# Patient Record
Sex: Female | Born: 1950
Health system: Southern US, Community
[De-identification: ages and names within clinical notes are randomized; demographics above are authoritative.]

## PROBLEM LIST (undated history)

## (undated) DIAGNOSIS — E7800 Pure hypercholesterolemia, unspecified: Secondary | ICD-10-CM

## (undated) HISTORY — PX: OTHER SURGICAL HISTORY: SHX169

## (undated) HISTORY — PX: BREAST BIOPSY: SHX20

## (undated) HISTORY — PX: APPENDECTOMY: SHX54

---

## 2006-03-16 ENCOUNTER — Ambulatory Visit: Payer: Self-pay | Admitting: Family Medicine

## 2006-03-19 ENCOUNTER — Ambulatory Visit: Payer: Self-pay | Admitting: Family Medicine

## 2006-03-26 ENCOUNTER — Ambulatory Visit: Payer: Self-pay | Admitting: Family Medicine

## 2006-04-01 ENCOUNTER — Ambulatory Visit: Payer: Self-pay | Admitting: Family Medicine

## 2006-04-27 DIAGNOSIS — J189 Pneumonia, unspecified organism: Secondary | ICD-10-CM | POA: Insufficient documentation

## 2006-04-27 DIAGNOSIS — J449 Chronic obstructive pulmonary disease, unspecified: Secondary | ICD-10-CM

## 2006-04-27 DIAGNOSIS — J4489 Other specified chronic obstructive pulmonary disease: Secondary | ICD-10-CM | POA: Insufficient documentation

## 2006-04-30 ENCOUNTER — Ambulatory Visit: Payer: Self-pay | Admitting: Family Medicine

## 2006-05-05 ENCOUNTER — Encounter: Payer: Self-pay | Admitting: Family Medicine

## 2006-05-05 DIAGNOSIS — F17200 Nicotine dependence, unspecified, uncomplicated: Secondary | ICD-10-CM

## 2006-06-16 ENCOUNTER — Encounter: Payer: Self-pay | Admitting: Family Medicine

## 2006-06-16 ENCOUNTER — Other Ambulatory Visit: Admission: RE | Admit: 2006-06-16 | Discharge: 2006-06-16 | Payer: Self-pay | Admitting: Family Medicine

## 2006-06-16 ENCOUNTER — Ambulatory Visit: Payer: Self-pay | Admitting: Family Medicine

## 2006-06-16 LAB — CONVERTED CEMR LAB: Pap Smear: NEGATIVE

## 2006-06-18 ENCOUNTER — Encounter: Payer: Self-pay | Admitting: Family Medicine

## 2006-07-06 ENCOUNTER — Encounter: Payer: Self-pay | Admitting: Family Medicine

## 2006-07-07 LAB — CONVERTED CEMR LAB
ALT: 13 units/L (ref 0–35)
AST: 19 units/L (ref 0–37)
Calcium: 9.7 mg/dL (ref 8.4–10.5)
Chloride: 103 meq/L (ref 96–112)
Creatinine, Ser: 0.77 mg/dL (ref 0.40–1.20)
Sodium: 140 meq/L (ref 135–145)
Total CHOL/HDL Ratio: 8.9
Total Protein: 7.8 g/dL (ref 6.0–8.3)
VLDL: 80 mg/dL — ABNORMAL HIGH (ref 0–40)

## 2006-08-10 ENCOUNTER — Encounter: Payer: Self-pay | Admitting: Family Medicine

## 2006-08-18 ENCOUNTER — Encounter: Payer: Self-pay | Admitting: Family Medicine

## 2006-08-18 DIAGNOSIS — M949 Disorder of cartilage, unspecified: Secondary | ICD-10-CM

## 2006-08-18 DIAGNOSIS — M899 Disorder of bone, unspecified: Secondary | ICD-10-CM | POA: Insufficient documentation

## 2006-08-19 ENCOUNTER — Telehealth: Payer: Self-pay | Admitting: Family Medicine

## 2007-04-12 ENCOUNTER — Encounter: Payer: Self-pay | Admitting: Family Medicine

## 2007-04-26 ENCOUNTER — Telehealth: Payer: Self-pay | Admitting: Family Medicine

## 2007-04-26 ENCOUNTER — Encounter: Payer: Self-pay | Admitting: Family Medicine

## 2007-04-26 DIAGNOSIS — Z8679 Personal history of other diseases of the circulatory system: Secondary | ICD-10-CM | POA: Insufficient documentation

## 2007-04-26 DIAGNOSIS — J90 Pleural effusion, not elsewhere classified: Secondary | ICD-10-CM | POA: Insufficient documentation

## 2007-05-17 ENCOUNTER — Encounter: Payer: Self-pay | Admitting: Family Medicine

## 2007-11-03 ENCOUNTER — Ambulatory Visit: Payer: Self-pay | Admitting: Family Medicine

## 2007-11-03 ENCOUNTER — Encounter: Payer: Self-pay | Admitting: Family Medicine

## 2007-11-03 ENCOUNTER — Other Ambulatory Visit: Admission: RE | Admit: 2007-11-03 | Discharge: 2007-11-03 | Payer: Self-pay | Admitting: Family Medicine

## 2007-11-03 DIAGNOSIS — D649 Anemia, unspecified: Secondary | ICD-10-CM

## 2007-11-04 ENCOUNTER — Encounter: Payer: Self-pay | Admitting: Family Medicine

## 2007-11-07 ENCOUNTER — Encounter: Payer: Self-pay | Admitting: Family Medicine

## 2007-11-07 ENCOUNTER — Telehealth (INDEPENDENT_AMBULATORY_CARE_PROVIDER_SITE_OTHER): Payer: Self-pay | Admitting: *Deleted

## 2007-11-07 DIAGNOSIS — K5732 Diverticulitis of large intestine without perforation or abscess without bleeding: Secondary | ICD-10-CM | POA: Insufficient documentation

## 2007-11-07 LAB — CONVERTED CEMR LAB
HCV Ab: NEGATIVE
Hep A IgM: NEGATIVE

## 2007-11-08 DIAGNOSIS — E785 Hyperlipidemia, unspecified: Secondary | ICD-10-CM

## 2007-11-08 LAB — CONVERTED CEMR LAB
ALT: 95 units/L — ABNORMAL HIGH (ref 0–35)
AST: 116 units/L — ABNORMAL HIGH (ref 0–37)
Albumin: 3.9 g/dL (ref 3.5–5.2)
Alkaline Phosphatase: 71 units/L (ref 39–117)
BUN: 9 mg/dL (ref 6–23)
Basophils Absolute: 0.1 10*3/uL (ref 0.0–0.1)
Basophils Relative: 1 % (ref 0–1)
CO2: 23 meq/L (ref 19–32)
Calcium: 9.4 mg/dL (ref 8.4–10.5)
Chloride: 106 meq/L (ref 96–112)
Cholesterol: 253 mg/dL — ABNORMAL HIGH (ref 0–200)
Creatinine, Ser: 0.63 mg/dL (ref 0.40–1.20)
Eosinophils Absolute: 0.3 10*3/uL (ref 0.0–0.7)
Eosinophils Relative: 4 % (ref 0–5)
Glucose, Bld: 94 mg/dL (ref 70–99)
HCT: 44.4 % (ref 36.0–46.0)
HDL: 24 mg/dL — ABNORMAL LOW (ref >39)
Hemoglobin: 14.3 g/dL (ref 12.0–15.0)
Lymphocytes Relative: 43 % (ref 12–46)
Lymphs Abs: 2.8 10*3/uL (ref 0.7–4.0)
MCHC: 32.2 g/dL (ref 30.0–36.0)
MCV: 95.9 fL (ref 78.0–100.0)
Monocytes Absolute: 0.6 10*3/uL (ref 0.1–1.0)
Monocytes Relative: 9 % (ref 3–12)
Neutro Abs: 2.8 10*3/uL (ref 1.7–7.7)
Neutrophils Relative %: 43 % (ref 43–77)
Platelets: 330 10*3/uL (ref 150–400)
Potassium: 4.3 meq/L (ref 3.5–5.3)
RBC: 4.63 M/uL (ref 3.87–5.11)
RDW: 14.3 % (ref 11.5–15.5)
Sodium: 142 meq/L (ref 135–145)
TSH: 3.001 microintl units/mL (ref 0.350–5.50)
Total Bilirubin: 0.3 mg/dL (ref 0.3–1.2)
Total CHOL/HDL Ratio: 10.5
Total Protein: 7.4 g/dL (ref 6.0–8.3)
Triglycerides: 581 mg/dL — ABNORMAL HIGH (ref <150)
WBC: 6.6 10*3/uL (ref 4.0–10.5)

## 2007-11-09 ENCOUNTER — Encounter: Payer: Self-pay | Admitting: Family Medicine

## 2007-11-11 ENCOUNTER — Telehealth: Payer: Self-pay | Admitting: Family Medicine

## 2007-12-06 ENCOUNTER — Telehealth: Payer: Self-pay | Admitting: Family Medicine

## 2007-12-06 DIAGNOSIS — R945 Abnormal results of liver function studies: Secondary | ICD-10-CM

## 2007-12-08 ENCOUNTER — Encounter: Payer: Self-pay | Admitting: Family Medicine

## 2007-12-09 LAB — CONVERTED CEMR LAB
ALT: 65 units/L — ABNORMAL HIGH (ref 0–35)
Bilirubin, Direct: 0.1 mg/dL (ref 0.0–0.3)
Total Bilirubin: 0.4 mg/dL (ref 0.3–1.2)

## 2008-01-25 ENCOUNTER — Telehealth: Payer: Self-pay | Admitting: Family Medicine

## 2008-02-08 ENCOUNTER — Telehealth (INDEPENDENT_AMBULATORY_CARE_PROVIDER_SITE_OTHER): Payer: Self-pay | Admitting: *Deleted

## 2008-02-08 ENCOUNTER — Encounter: Payer: Self-pay | Admitting: Family Medicine

## 2008-02-09 ENCOUNTER — Encounter: Payer: Self-pay | Admitting: Family Medicine

## 2008-02-09 LAB — CONVERTED CEMR LAB
AST: 74 units/L — ABNORMAL HIGH (ref 0–37)
Alkaline Phosphatase: 71 units/L (ref 39–117)
Bilirubin, Direct: <0.1 mg/dL (ref 0.0–0.3)
Total Bilirubin: 0.3 mg/dL (ref 0.3–1.2)

## 2008-02-14 ENCOUNTER — Encounter: Admission: RE | Admit: 2008-02-14 | Discharge: 2008-02-14 | Payer: Self-pay | Admitting: Family Medicine

## 2010-08-11 ENCOUNTER — Encounter: Payer: Self-pay | Admitting: Family Medicine

## 2012-12-21 LAB — CBC AND DIFFERENTIAL
HEMATOCRIT: 44 % (ref 36–46)
HEMOGLOBIN: 13.5 g/dL (ref 12.0–16.0)
Platelets: 342 10*3/uL (ref 150–399)

## 2012-12-21 LAB — BASIC METABOLIC PANEL
BUN: 9 mg/dL (ref 4–21)
Creatinine: 0.6 mg/dL (ref 0.5–1.1)
Glucose: 91 mg/dL
Potassium: 4.3 mmol/L (ref 3.4–5.3)
SODIUM: 140 mmol/L (ref 137–147)

## 2012-12-21 LAB — LIPID PANEL
Cholesterol: 278 mg/dL — AB (ref 0–200)
HDL: 26 mg/dL — AB (ref 35–70)
Triglycerides: 266 mg/dL — AB (ref 40–160)

## 2012-12-21 LAB — TSH: TSH: 1.82 u[IU]/mL (ref 0.41–5.90)

## 2012-12-21 LAB — HEPATIC FUNCTION PANEL
ALK PHOS: 99 U/L (ref 25–125)
ALT: 16 U/L (ref 7–35)
AST: 29 U/L (ref 13–35)
Bilirubin, Total: 0.3 mg/dL

## 2013-08-23 ENCOUNTER — Ambulatory Visit (INDEPENDENT_AMBULATORY_CARE_PROVIDER_SITE_OTHER): Admitting: Physician Assistant

## 2013-08-23 ENCOUNTER — Encounter: Payer: Self-pay | Admitting: Physician Assistant

## 2013-08-23 VITALS — BP 134/75 | HR 82 | Temp 98.9°F | Ht 66.6 in | Wt 140.0 lb

## 2013-08-23 DIAGNOSIS — M899 Disorder of bone, unspecified: Secondary | ICD-10-CM

## 2013-08-23 DIAGNOSIS — F17200 Nicotine dependence, unspecified, uncomplicated: Secondary | ICD-10-CM

## 2013-08-23 DIAGNOSIS — Z720 Tobacco use: Secondary | ICD-10-CM

## 2013-08-23 DIAGNOSIS — E785 Hyperlipidemia, unspecified: Secondary | ICD-10-CM

## 2013-08-23 DIAGNOSIS — J449 Chronic obstructive pulmonary disease, unspecified: Secondary | ICD-10-CM

## 2013-08-23 DIAGNOSIS — M8580 Other specified disorders of bone density and structure, unspecified site: Secondary | ICD-10-CM

## 2013-08-23 DIAGNOSIS — M949 Disorder of cartilage, unspecified: Secondary | ICD-10-CM

## 2013-08-23 DIAGNOSIS — M3320 Polymyositis, organ involvement unspecified: Secondary | ICD-10-CM

## 2013-08-23 NOTE — Patient Instructions (Signed)
Vitamin D 1000 units daily  Calcium 1200mg daily

## 2013-08-25 ENCOUNTER — Encounter: Payer: Self-pay | Admitting: Physician Assistant

## 2013-08-25 DIAGNOSIS — M8580 Other specified disorders of bone density and structure, unspecified site: Secondary | ICD-10-CM | POA: Insufficient documentation

## 2013-08-25 DIAGNOSIS — Z720 Tobacco use: Secondary | ICD-10-CM | POA: Insufficient documentation

## 2013-08-25 NOTE — Progress Notes (Signed)
   Subjective:    Patient ID: Kristen Gay, female    DOB: June 26, 1951, 63 y.o.   MRN: [id number]  HPI Patient is a 63 year old African American female who presents to the clinic to establish care. Patient has a past medical history of polymyositis per pt. She has a long history of trying prednisone and other medications to control. She is currently off all medications except for Motrin as needed and lovaza  daily. She has not had her fasting labs checked in a while. She is unaware of her last tetanus shot. Her last mammogram was 4/14 and up-to-date.    Patient has no ongoing concerns today. . Active Ambulatory Problems    Diagnosis Date Noted  . HYPERLIPIDEMIA 11/08/2007  . ANEMIA 11/03/2007  . TOBACCO USE 05/05/2006  . PERICARDITIS, ACUTE, VIRAL 04/26/2007  . PNEUMONIA, UNSPECIFIED 04/27/2006  . COPD 04/27/2006  . PLEURAL EFFUSION 04/26/2007  . DIVERTICULITIS OF COLON 11/07/2007  . OSTEOPENIA 08/18/2006  . LIVER FUNCTION TESTS, ABNORMAL 12/06/2007  . Polymyositis 08/23/2013  . Osteopenia 08/25/2013   Resolved Ambulatory Problems    Diagnosis Date Noted  . No Resolved Ambulatory Problems   No Additional Past Medical History   . History   Social History  . Marital Status: Married    Spouse Name: N/A    Number of Children: N/A  . Years of Education: N/A   Occupational History  . Not on file.   Social History Main Topics  . Smoking status: Current Every Day Smoker -- 1.00 packs/day for 25 years    Types: Cigarettes  . Smokeless tobacco: Not on file  . Alcohol Use: 0.0 oz/week    0 drink(s) per week  . Drug Use: Not on file  . Sexual Activity: Not on file   Other Topics Concern  . Not on file   Social History Narrative  . No narrative on file      Review of Systems     Objective:   Physical Exam  Constitutional: She is oriented to person, place, and time. She appears well-developed and well-nourished.  HENT:  Head: Normocephalic and atraumatic.   Cardiovascular: Normal rate, regular rhythm and normal heart sounds.   Pulmonary/Chest: Effort normal and breath sounds normal. She has no wheezes.  Neurological: She is alert and oriented to person, place, and time.  Skin: Skin is dry.  Psychiatric: She has a normal mood and affect. Her behavior is normal.          Assessment & Plan:  Patient had no concerns today. I tried to intervene with some health maintenance that was 2. Patient seemed uninterested in anything I advised. Her foot patient will followup with complete physical and we can reiterate the items.  Patient has any needs for health maintenance issue. Discussed with diagnosis of osteopenia a bone density. Patient declined today. Offered fasting labs at this point patient declined today. Discussed with patient need for screening colonoscopy, tetanus, and Zostavax. Patient declined all of these interventions at this time. Discussed with patient she could schedule a complete physical and we could address any concerns after she does research on all these health maintenance items.  COPD-Patient denies any problems breathing. She continues to smoke. We'll continue to follow as needed.  Tobacco abuse-smoking cessation counseling was given today. Patient declined any intervention.

## 2013-09-20 ENCOUNTER — Encounter: Admitting: Physician Assistant

## 2013-10-10 ENCOUNTER — Encounter: Payer: Self-pay | Admitting: Family Medicine

## 2013-10-10 ENCOUNTER — Ambulatory Visit: Admitting: Family Medicine

## 2013-10-10 VITALS — BP 110/59 | HR 84 | Wt 136.0 lb

## 2013-10-10 DIAGNOSIS — E785 Hyperlipidemia, unspecified: Secondary | ICD-10-CM

## 2013-10-10 DIAGNOSIS — M899 Disorder of bone, unspecified: Secondary | ICD-10-CM

## 2013-10-10 DIAGNOSIS — Z0000 Encounter for general adult medical examination without abnormal findings: Secondary | ICD-10-CM

## 2013-10-10 DIAGNOSIS — F17200 Nicotine dependence, unspecified, uncomplicated: Secondary | ICD-10-CM

## 2013-10-10 DIAGNOSIS — M3320 Polymyositis, organ involvement unspecified: Secondary | ICD-10-CM

## 2013-10-10 DIAGNOSIS — M949 Disorder of cartilage, unspecified: Secondary | ICD-10-CM | POA: Diagnosis not present

## 2013-10-10 LAB — COMPLETE METABOLIC PANEL WITH GFR
ALK PHOS: 80 U/L (ref 39–117)
ALT: 21 U/L (ref 0–35)
AST: 37 U/L (ref 0–37)
Albumin: 4.4 g/dL (ref 3.5–5.2)
BUN: 14 mg/dL (ref 6–23)
CHLORIDE: 103 meq/L (ref 96–112)
CO2: 28 mEq/L (ref 19–32)
CREATININE: 0.67 mg/dL (ref 0.50–1.10)
Calcium: 10.3 mg/dL (ref 8.4–10.5)
GFR, Est African American: >89 mL/min
GFR, Est Non African American: >89 mL/min
Glucose, Bld: 89 mg/dL (ref 70–99)
Potassium: 4.8 mEq/L (ref 3.5–5.3)
Sodium: 140 mEq/L (ref 135–145)
Total Bilirubin: 0.5 mg/dL (ref 0.2–1.2)
Total Protein: 8.6 g/dL — ABNORMAL HIGH (ref 6.0–8.3)

## 2013-10-10 LAB — LIPID PANEL
CHOL/HDL RATIO: 10.2 ratio
CHOLESTEROL: 255 mg/dL — AB (ref 0–200)
HDL: 25 mg/dL — AB (ref >39)
LDL CALC: 194 mg/dL — AB (ref 0–99)
TRIGLYCERIDES: 180 mg/dL — AB (ref <150)
VLDL: 36 mg/dL (ref 0–40)

## 2013-10-10 LAB — CK: Total CK: 729 U/L — ABNORMAL HIGH (ref 7–177)

## 2013-10-10 NOTE — Progress Notes (Signed)
Subjective:    Patient ID: Kristen Gay, female    DOB: March 03, 1951, 63 y.o.   MRN: [id number]  HPI Here for CPE - last eye exam was years ago. 58 some old records for me to review.  She thinks her tetanus is uptodate.  She does not have advanced directives that she has had discussions with her husband about what she would like for end-of-life care.   Review of Systems  Comprehensive ROS is negative.    There were no vitals taken for this visit.    Allergies  Allergen Reactions  . Codeine   . Cortisone     No past medical history on file.  Past Surgical History  Procedure Laterality Date  . Carpel tunnel release Bilateral     History   Social History  . Marital Status: Married    Spouse Name: N/A    Number of Children: N/A  . Years of Education: N/A   Occupational History  . Not on file.   Social History Main Topics  . Smoking status: Current Every Day Smoker -- 1.00 packs/day for 25 years    Types: Cigarettes  . Smokeless tobacco: Not on file  . Alcohol Use: 0.0 oz/week    0 drink(s) per week  . Drug Use: Not on file  . Sexual Activity: Not on file   Other Topics Concern  . Not on file   Social History Narrative  . No narrative on file    No family history on file.  Outpatient Encounter Prescriptions as of 10/10/2013  Medication Sig  . ibuprofen (ADVIL,MOTRIN) 200 MG tablet Take 200 mg by mouth every 6 (six) hours as needed.  . Omega-3-acid Ethyl Esters (LOVAZA PO) Take by mouth.          Objective:   Physical Exam  Constitutional: She is oriented to person, place, and time. She appears well-developed and well-nourished.  HENT:  Head: Normocephalic and atraumatic.  Right Ear: External ear normal.  Left Ear: External ear normal.  Nose: Nose normal.  Mouth/Throat: Oropharynx is clear and moist.  TMs and canals are clear.   Eyes: Conjunctivae and EOM are normal. Pupils are equal, round, and reactive to light.  Neck: Neck supple. No  thyromegaly present.  Cardiovascular: Normal rate, regular rhythm and normal heart sounds.   No carotid or abdominal bruits  Pulmonary/Chest: Effort normal and breath sounds normal. She has no wheezes.  Abdominal: Soft. Bowel sounds are normal. She exhibits no distension and no mass. There is no tenderness. There is no rebound and no guarding.  Musculoskeletal: She exhibits no edema.  Lymphadenopathy:    She has no cervical adenopathy.  Neurological: She is alert and oriented to person, place, and time.  Skin: Skin is warm and dry.  Psychiatric: She has a normal mood and affect.          Assessment & Plan:  CPE -  Keep up a regular exercise program and make sure you are eating a healthy diet Try to eat 4 servings of dairy a day, or if you are lactose intolerant take a calcium with vitamin D daily.  Your vaccines are up to date.    Due for Tdap. She declined at this time she thinks it is up to date. Due for shinglges vaccine. she declines she will site seen. Discussed having advanced directives.   She's not sure exactly when her last bone density test was. Will try to get a copy of this. She does  have a history of osteopenia we'll check vitamin D level today.  tob abuse - has cut back but not ready to quit. She's exam were between 3-8 cigarettes per day. Encouraged to continue to work on weaning. Handout provided on cessation. Please call if interested in [place] quitting in if I can be of any assistance.  Discussed the need for advanced directives.

## 2013-10-10 NOTE — Patient Instructions (Addendum)
Keep up a regular exercise program and make sure you are eating a healthy diet Try to eat 4 servings of dairy a day, or if you are lactose intolerant take a calcium with vitamin D daily.  Your vaccines are up to date.    Smoking Cessation, Tips for Success If you are ready to quit smoking, congratulations! You have chosen to help yourself be healthier. Cigarettes bring nicotine, tar, carbon monoxide, and other irritants into your body. Your lungs, heart, and blood vessels will be able to work better without these poisons. There are many different ways to quit smoking. Nicotine gum, nicotine patches, a nicotine inhaler, or nicotine nasal spray can help with physical craving. Hypnosis, support groups, and medicines help break the habit of smoking. WHAT THINGS CAN I DO TO MAKE QUITTING EASIER?  Here are some tips to help you quit for good:  Pick a date when you will quit smoking completely. Tell all of your friends and family about your plan to quit on that date.  Do not try to slowly cut down on the number of cigarettes you are smoking. Pick a quit date and quit smoking completely starting on that day.  Throw away all cigarettes.   Clean and remove all ashtrays from your home, work, and car.   On a card, write down your reasons for quitting. Carry the card with you and read it when you get the urge to smoke.   Cleanse your body of nicotine. Drink enough water and fluids to keep your urine clear or pale yellow. Do this after quitting to flush the nicotine from your body.   Learn to predict your moods. Do not let a bad situation be your excuse to have a cigarette. Some situations in your life might tempt you into wanting a cigarette.   Never have "just one" cigarette. It leads to wanting another and another. Remind yourself of your decision to quit.   Change habits associated with smoking. If you smoked while driving or when feeling stressed, try other activities to replace smoking. Stand  up when drinking your coffee. Brush your teeth after eating. Sit in a different chair when you read the paper. Avoid alcohol while trying to quit, and try to drink fewer caffeinated beverages. Alcohol and caffeine may urge you to smoke.   Avoid foods and drinks that can trigger a desire to smoke, such as sugary or spicy foods and alcohol.   Ask people who smoke not to smoke around you.   Have something planned to do right after eating or having a cup of coffee. For example, plan to take a walk or exercise.   Try a relaxation exercise to calm you down and decrease your stress. Remember, you may be tense and nervous for the first 2 weeks after you quit, but this will pass.   Find new activities to keep your hands busy. Play with a pen, coin, or rubber band. Doodle or draw things on paper.   Brush your teeth right after eating. This will help cut down on the craving for the taste of tobacco after meals. You can also try mouthwash.   Use oral substitutes in place of cigarettes. Try using lemon drops, carrots, cinnamon sticks, or chewing gum. Keep them handy so they are available when you have the urge to smoke.   When you have the urge to smoke, try deep breathing.   Designate your home as a nonsmoking area.   If you are a heavy smoker,  ask your health care provider about a prescription for nicotine chewing gum. It can ease your withdrawal from nicotine.   Reward yourself. Set aside the cigarette money you save and buy yourself something nice.   Look for support from others. Join a support group or smoking cessation program. Ask someone at home or at work to help you with your plan to quit smoking.   Always ask yourself, "Do I need this cigarette or is this just a reflex?" Tell yourself, "Today, I choose not to smoke," or "I do not want to smoke." You are reminding yourself of your decision to quit.  Do not replace cigarette smoking with electronic cigarettes (commonly called  e-cigarettes). The safety of e-cigarettes is unknown, and some may contain harmful chemicals.  If you relapse, do not give up! Plan ahead and think about what you will do the next time you get the urge to smoke.  HOW WILL I FEEL WHEN I QUIT SMOKING? You may have symptoms of withdrawal because your body is used to nicotine (the addictive substance in cigarettes). You may crave cigarettes, be irritable, feel very hungry, cough often, get headaches, or have difficulty concentrating. The withdrawal symptoms are only temporary. They are strongest when you first quit but will go away within 10 14 days. When withdrawal symptoms occur, stay in control. Think about your reasons for quitting. Remind yourself that these are signs that your body is healing and getting used to being without cigarettes. Remember that withdrawal symptoms are easier to treat than the major diseases that smoking can cause.  Even after the withdrawal is over, expect periodic urges to smoke. However, these cravings are generally short lived and will go away whether you smoke or not. Do not smoke!  WHAT RESOURCES ARE AVAILABLE TO HELP ME QUIT SMOKING? Your health care provider can direct you to community resources or hospitals for support, which may include:  Group support.  Education.  Hypnosis.  Therapy. Document Released: 04/03/2004 Document Revised: 04/26/2013 Document Reviewed: 12/22/2012 [facility] Patient Information 2014 [place], [place].

## 2013-10-11 ENCOUNTER — Other Ambulatory Visit: Payer: Self-pay | Admitting: *Deleted

## 2013-10-11 DIAGNOSIS — R779 Abnormality of plasma protein, unspecified: Secondary | ICD-10-CM

## 2013-10-11 LAB — VITAMIN D 25 HYDROXY (VIT D DEFICIENCY, FRACTURES): Vit D, 25-Hydroxy: 32 ng/mL (ref 30–89)

## 2013-10-11 MED ORDER — OMEGA-3-ACID ETHYL ESTERS 1 G PO CAPS: 2.0000 g | ORAL_CAPSULE | Freq: Two times a day (BID) | ORAL | Status: DC

## 2013-10-12 ENCOUNTER — Telehealth: Payer: Self-pay | Admitting: Family Medicine

## 2013-10-12 NOTE — Telephone Encounter (Signed)
Patient walked-in request to let you know that she is taking Vitamin B-complex Calcium 500mg s to keep in her chart record. Pt advised Dr. Lacie Scotts mentioned eye doctor but she did remember which one they talked about does she need a referral or can she just walk-in to the eye dr and which one in [place] was the best. Thanks

## 2013-10-23 ENCOUNTER — Ambulatory Visit (INDEPENDENT_AMBULATORY_CARE_PROVIDER_SITE_OTHER): Payer: Medicare Other | Admitting: Family Medicine

## 2013-10-23 ENCOUNTER — Encounter: Payer: Self-pay | Admitting: Family Medicine

## 2013-10-23 VITALS — BP 104/66 | HR 84 | Wt 137.0 lb

## 2013-10-23 DIAGNOSIS — E8809 Other disorders of plasma-protein metabolism, not elsewhere classified: Secondary | ICD-10-CM

## 2013-10-23 DIAGNOSIS — E785 Hyperlipidemia, unspecified: Secondary | ICD-10-CM

## 2013-10-23 DIAGNOSIS — M3320 Polymyositis, organ involvement unspecified: Secondary | ICD-10-CM | POA: Diagnosis not present

## 2013-10-23 DIAGNOSIS — R779 Abnormality of plasma protein, unspecified: Secondary | ICD-10-CM

## 2013-10-23 NOTE — Patient Instructions (Signed)
Call me in 2 months if ready to start the cholesterol lowering drug.

## 2013-10-23 NOTE — Progress Notes (Signed)
   Subjective:    Patient ID: Cleophas Dunker, female    DOB: 1951-07-15, 63 y.o.   MRN: [id number]  HPI Hyperlipidemia - last levels were high.  Restarted her lovaza.  Seh is hesistant to start a statin.  Seh says she has heard bad things about them.    Vit d was borderline low on labwork.  She says plans to go to [facility] to get a Vit D supplement.    Elevated protein levels. She has been drinks teh occ protein drink but usually not even a whole drink.    Polymyositis - hasn't had any muscle pain or weakness with a CK over 700.  Saw. Dr. Bland Span, neuorlogy, about a week ago.   Review of Systems     Objective:   Physical Exam  Constitutional: She is oriented to person, place, and time. She appears well-developed and well-nourished.  HENT:  Head: Normocephalic and atraumatic.  Eyes: Conjunctivae and EOM are normal.  Cardiovascular: Normal rate.   Pulmonary/Chest: Effort normal.  Neurological: She is alert and oriented to person, place, and time.  Skin: Skin is dry. No pallor.  Psychiatric: She has a normal mood and affect. Her behavior is normal.          Assessment & Plan:  Vitamin D def - Recommend oral supplement of [id number] IU daily. .    Polymyosisitis - Followed by Dr. Noah Charon.  Has f/u in 2 months to recheck CK.    Elevated protein levels. - will recheck level today. Have been normal in the past.   Hyperlipidemia - Discussed the benefits of being on a statin compared her Lovaza. We discussed different options including waiting until her followup appointment to recheck her CK in 2 months with polymyositis. Versus starting a statin now. She would like to wait until her followup appointment as to make sure her CK is not continuing to increase. We can certainly monitor the CK very carefully if we start a statin. We will need to monitor liver enzymes as well as she has had elevated liver enzymes in the past.

## 2013-10-31 ENCOUNTER — Telehealth: Payer: Self-pay | Admitting: *Deleted

## 2013-10-31 ENCOUNTER — Encounter: Payer: Self-pay | Admitting: *Deleted

## 2013-10-31 ENCOUNTER — Encounter: Payer: Self-pay | Admitting: Family Medicine

## 2013-10-31 DIAGNOSIS — D1803 Hemangioma of intra-abdominal structures: Secondary | ICD-10-CM | POA: Insufficient documentation

## 2013-10-31 DIAGNOSIS — N281 Cyst of kidney, acquired: Secondary | ICD-10-CM | POA: Insufficient documentation

## 2013-10-31 NOTE — Telephone Encounter (Signed)
Pt called and informed that immunization record was not in her records that she left her for review. She stated that she will bring these in .Teddy Spike

## 2013-11-07 DIAGNOSIS — L723 Sebaceous cyst: Secondary | ICD-10-CM | POA: Diagnosis not present

## 2013-11-08 DIAGNOSIS — L723 Sebaceous cyst: Secondary | ICD-10-CM | POA: Diagnosis not present

## 2013-11-20 DIAGNOSIS — Z1231 Encounter for screening mammogram for malignant neoplasm of breast: Secondary | ICD-10-CM | POA: Diagnosis not present

## 2013-12-13 ENCOUNTER — Encounter: Payer: Self-pay | Admitting: Family Medicine

## 2013-12-19 DIAGNOSIS — M3320 Polymyositis, organ involvement unspecified: Secondary | ICD-10-CM | POA: Diagnosis not present

## 2014-01-29 ENCOUNTER — Telehealth: Payer: Self-pay | Admitting: *Deleted

## 2014-01-29 NOTE — Telephone Encounter (Addendum)
Pt called and stated that she was charged for her physical and this was supposed to be free. She stated that she called someone in billing (mary ann) she asked why this was she stated that she was charged due to her problems. I told her that I would forward this to Dr. Madilyn Fireman for follow up.Kristen Gay'

## 2014-02-01 NOTE — Telephone Encounter (Signed)
Hi Rodman Key, can you take a look at the CPE in March.  I am not sure what is going on. I billed a physical.  Not sure if this should have been a welcome to medicare?? If so I can have her complete the form and add addendum. She was told by billing this was a problem visit which is was not.  Any htoughts.

## 2014-02-02 NOTE — Telephone Encounter (Signed)
Called pt and informed her of outcome.Kristen Gay [place]

## 2014-02-02 NOTE — Telephone Encounter (Signed)
Okay. Sounds good. Please remove the charge if possible. And we will let the patient.

## 2014-02-02 NOTE — Telephone Encounter (Signed)
Dr. Madilyn Fireman,  I reviewed patient's account and the charge was appropriate. I think the problem here patient's Medicare became effective 09/17/2013, which becomes primary coverage. This would need to be a Welcome to Medicare exam to be covered under Medicare plan. Since this visit occurred more than 30 days ago, I wouldn't recommend an addendum to your documentation instead charge voided as courtesy to the patient but going forward her Medicare should be sequenced as primary so that it would alert our staff. Please let me know your thoughts.  Thanks Rodman Key

## 2014-04-23 DIAGNOSIS — M3320 Polymyositis, organ involvement unspecified: Secondary | ICD-10-CM | POA: Diagnosis not present

## 2014-04-24 ENCOUNTER — Ambulatory Visit: Admitting: Family Medicine

## 2014-05-11 DIAGNOSIS — D125 Benign neoplasm of sigmoid colon: Secondary | ICD-10-CM | POA: Diagnosis not present

## 2014-05-11 DIAGNOSIS — D127 Benign neoplasm of rectosigmoid junction: Secondary | ICD-10-CM | POA: Diagnosis not present

## 2014-05-11 DIAGNOSIS — D120 Benign neoplasm of cecum: Secondary | ICD-10-CM | POA: Diagnosis not present

## 2014-05-11 DIAGNOSIS — D374 Neoplasm of uncertain behavior of colon: Secondary | ICD-10-CM | POA: Diagnosis not present

## 2014-05-11 DIAGNOSIS — Z1211 Encounter for screening for malignant neoplasm of colon: Secondary | ICD-10-CM | POA: Diagnosis not present

## 2014-05-14 ENCOUNTER — Encounter: Payer: Self-pay | Admitting: Family Medicine

## 2014-05-14 ENCOUNTER — Ambulatory Visit (INDEPENDENT_AMBULATORY_CARE_PROVIDER_SITE_OTHER): Payer: Medicare Other | Admitting: Family Medicine

## 2014-05-14 VITALS — BP 127/75 | HR 90 | Temp 98.0°F | Wt 134.0 lb

## 2014-05-14 DIAGNOSIS — Z720 Tobacco use: Secondary | ICD-10-CM

## 2014-05-14 DIAGNOSIS — R779 Abnormality of plasma protein, unspecified: Secondary | ICD-10-CM

## 2014-05-14 DIAGNOSIS — E8809 Other disorders of plasma-protein metabolism, not elsewhere classified: Secondary | ICD-10-CM

## 2014-05-14 DIAGNOSIS — E785 Hyperlipidemia, unspecified: Secondary | ICD-10-CM | POA: Diagnosis not present

## 2014-05-14 DIAGNOSIS — Z23 Encounter for immunization: Secondary | ICD-10-CM

## 2014-05-14 DIAGNOSIS — F17200 Nicotine dependence, unspecified, uncomplicated: Secondary | ICD-10-CM

## 2014-05-14 NOTE — Progress Notes (Signed)
   Subjective:    Patient ID: Kristen Gay, female    DOB: [birth year], 63 y.o.   MRN: [id number]  Hyperlipidemia   follow-up elevated protein levels-back and made her protein levels were high. We have contacted her and she had been drinking protein shakes. She is backed off those.  Hyperlipidemia-she  Had declined a statin in March. At the time she wanted to get back on her Levoxyl. But today she is not actually taking it.  She has been on the red yeast rice for a few months. She is absolutely against taking a statin. She says she certainly bad things on television.  Review of Systems     Objective:   Physical Exam  Constitutional: She is oriented to person, place, and time. She appears well-developed and well-nourished.  HENT:  Head: Normocephalic and atraumatic.  Cardiovascular: Normal rate, regular rhythm and normal heart sounds.   Pulmonary/Chest: Effort normal and breath sounds normal.  Neurological: She is alert and oriented to person, place, and time.  Skin: Skin is warm and dry.  Psychiatric: She has a normal mood and affect. Her behavior is normal.          Assessment & Plan:  Elevated protein levels-we'll recheck level today. She's no longer drinking the protein shakes.  Hyperlipidemia-we discussed the pros and cons of statins. And that sometimes the benefits do outweigh the risks that we did discuss things that would need to be monitored if she were to start taking a statin. She still declines today. We discussed the importance of smoking cessation as well. She is artery thin. Continue work on diet. We'll recheck lipids today and see the red yeast rice has made any improvement.  Tobacco abuse-encourage cessation. She has smoked for over 30 years. And her pulse ox is down to 94% today and on previous visits. I would recommend further evaluation of her lungs with spirometry. She denies any acute shortness of breath.

## 2014-05-15 LAB — COMPLETE METABOLIC PANEL WITH GFR
ALBUMIN: 4 g/dL (ref 3.5–5.2)
ALK PHOS: 68 U/L (ref 39–117)
ALT: 24 U/L (ref 0–35)
AST: 34 U/L (ref 0–37)
BUN: 12 mg/dL (ref 6–23)
CO2: 27 mEq/L (ref 19–32)
Calcium: 9.9 mg/dL (ref 8.4–10.5)
Chloride: 100 mEq/L (ref 96–112)
Creat: 0.64 mg/dL (ref 0.50–1.10)
GFR, Est African American: >89 mL/min
GFR, Est Non African American: >89 mL/min
Glucose, Bld: 85 mg/dL (ref 70–99)
POTASSIUM: 4.2 meq/L (ref 3.5–5.3)
Sodium: 137 mEq/L (ref 135–145)
Total Bilirubin: 0.4 mg/dL (ref 0.2–1.2)
Total Protein: 8.2 g/dL (ref 6.0–8.3)

## 2014-05-15 LAB — LIPID PANEL
CHOL/HDL RATIO: 6.7 ratio
Cholesterol: 209 mg/dL — ABNORMAL HIGH (ref 0–200)
HDL: 31 mg/dL — ABNORMAL LOW (ref >39)
LDL Cholesterol: 141 mg/dL — ABNORMAL HIGH (ref 0–99)
Triglycerides: 184 mg/dL — ABNORMAL HIGH (ref <150)
VLDL: 37 mg/dL (ref 0–40)

## 2014-05-21 ENCOUNTER — Encounter: Payer: Self-pay | Admitting: Family Medicine

## 2014-07-23 ENCOUNTER — Telehealth: Payer: Self-pay | Admitting: *Deleted

## 2014-07-23 NOTE — Telephone Encounter (Signed)
Pt called and lvm asking for an appt. Called her back and lvm informing her that appt was made for tomorrow @ 215pm if any problems w/this time asked to call back.Kristen Gay

## 2014-07-24 ENCOUNTER — Encounter: Payer: Self-pay | Admitting: Family Medicine

## 2014-07-24 ENCOUNTER — Ambulatory Visit (INDEPENDENT_AMBULATORY_CARE_PROVIDER_SITE_OTHER): Payer: Medicare Other | Admitting: Family Medicine

## 2014-07-24 ENCOUNTER — Ambulatory Visit: Admitting: Family Medicine

## 2014-07-24 VITALS — BP 145/84 | HR 96 | Ht 66.75 in | Wt 138.0 lb

## 2014-07-24 DIAGNOSIS — R208 Other disturbances of skin sensation: Secondary | ICD-10-CM

## 2014-07-24 DIAGNOSIS — R200 Anesthesia of skin: Secondary | ICD-10-CM

## 2014-07-24 DIAGNOSIS — L03116 Cellulitis of left lower limb: Secondary | ICD-10-CM | POA: Diagnosis not present

## 2014-07-24 MED ORDER — CEPHALEXIN 500 MG PO CAPS: 500.0000 mg | ORAL_CAPSULE | Freq: Four times a day (QID) | ORAL | Status: DC

## 2014-07-24 NOTE — Patient Instructions (Signed)

## 2014-07-24 NOTE — Progress Notes (Signed)
   Subjective:    Patient ID: Kristen Gay, female    DOB: 01-05-1951, 64 y.o.   MRN: [id number]  HPI About 1 week ago noticed a fullness in the left groin. Says it was particularly uncomfortable at night. Was painful to move then was painful to walk. Also started noticing some numbness in the left anterior thigh area. About 2-3 days after this started noticed a red lump on the outer upper right thigh and 3 smaller papules on anterior upper thigh about 6 cm from the groin crease.  Took 4 doses of keflex, belonged to her husband,  over weekend and felt some better. No fever, chills or sweats. No injury. Not insect bites etc that she is aware of. The numbness in the thigh seems to be improving.   Review of Systems     Objective:   Physical Exam  Constitutional: She appears well-developed and well-nourished.  HENT:  Head: Normocephalic.  Musculoskeletal:       Legs: Lymphadenopathy:       Left: No inguinal adenopathy present.  Skin: Skin is warm and dry.  Psychiatric: She has a normal mood and affect. Her behavior is normal.          Assessment & Plan:  Cellulitis - will tx with keflex since did get a partial response to 4 doses.  F/U if not betterl. Call if fever, spreading erythema or getting worse.   As for the pain and numbness - consider meralgia paresthetica. It seems to be improving. Discussed wearing loose clothing and avoid putting pressure on the groin crease area with tight fitting clothig etc. Could have been a swollen LN as well though none on exam today.

## 2014-08-10 ENCOUNTER — Other Ambulatory Visit: Admitting: Family Medicine

## 2014-10-17 ENCOUNTER — Telehealth: Payer: Self-pay | Admitting: *Deleted

## 2014-10-17 DIAGNOSIS — M3320 Polymyositis, organ involvement unspecified: Secondary | ICD-10-CM

## 2014-10-17 DIAGNOSIS — Z0000 Encounter for general adult medical examination without abnormal findings: Secondary | ICD-10-CM

## 2014-10-17 DIAGNOSIS — M8580 Other specified disorders of bone density and structure, unspecified site: Secondary | ICD-10-CM

## 2014-10-17 DIAGNOSIS — E785 Hyperlipidemia, unspecified: Secondary | ICD-10-CM

## 2014-10-17 NOTE — Telephone Encounter (Signed)
Pt received a call about wellness exam. .Kristen Gay

## 2014-10-19 DIAGNOSIS — G43D0 Abdominal migraine, not intractable: Secondary | ICD-10-CM | POA: Diagnosis not present

## 2014-10-22 ENCOUNTER — Encounter: Admitting: Family Medicine

## 2014-10-24 DIAGNOSIS — M3320 Polymyositis, organ involvement unspecified: Secondary | ICD-10-CM | POA: Diagnosis not present

## 2014-11-06 ENCOUNTER — Other Ambulatory Visit (HOSPITAL_COMMUNITY)
Admission: RE | Admit: 2014-11-06 | Discharge: 2014-11-06 | Disposition: A | Discharge status: Home / Self Care | Payer: Medicare Other | Source: Ambulatory Visit | Attending: Family Medicine | Admitting: Family Medicine

## 2014-11-06 ENCOUNTER — Ambulatory Visit (INDEPENDENT_AMBULATORY_CARE_PROVIDER_SITE_OTHER): Payer: Medicare Other | Admitting: Family Medicine

## 2014-11-06 ENCOUNTER — Encounter: Payer: Self-pay | Admitting: Family Medicine

## 2014-11-06 VITALS — BP 127/84 | HR 86 | Ht 67.0 in | Wt 139.0 lb

## 2014-11-06 DIAGNOSIS — Z1151 Encounter for screening for human papillomavirus (HPV): Secondary | ICD-10-CM | POA: Insufficient documentation

## 2014-11-06 DIAGNOSIS — Z124 Encounter for screening for malignant neoplasm of cervix: Secondary | ICD-10-CM | POA: Diagnosis not present

## 2014-11-06 DIAGNOSIS — Z01419 Encounter for gynecological examination (general) (routine) without abnormal findings: Secondary | ICD-10-CM | POA: Insufficient documentation

## 2014-11-06 DIAGNOSIS — Z23 Encounter for immunization: Secondary | ICD-10-CM

## 2014-11-06 DIAGNOSIS — Z0000 Encounter for general adult medical examination without abnormal findings: Secondary | ICD-10-CM | POA: Diagnosis not present

## 2014-11-06 MED ORDER — AMBULATORY NON FORMULARY MEDICATION: Status: DC

## 2014-11-06 NOTE — Patient Instructions (Signed)
Keep up a regular exercise program and make sure you are eating a healthy diet Try to eat 4 servings of dairy a day, or if you are lactose intolerant take a calcium with vitamin D daily.  Your vaccines are up to date.   

## 2014-11-06 NOTE — Progress Notes (Signed)
Subjective:    Kristen Gay is a 64 y.o. female who presents for Medicare Annual/Subsequent preventive examination.  Preventive Screening-Counseling & Management  Tobacco History  Smoking status  . Current Every Day Smoker -- 1.00 packs/day for 25 years  . Types: Cigarettes  Smokeless tobacco  . Not on file     Problems Prior to Visit 1.   Current Problems (verified) Patient Active Problem List   Diagnosis Date Noted  . Liver hemangioma 10/31/2013  . Bilateral renal cysts 10/31/2013  . Osteopenia 08/25/2013  . Polymyositis 08/23/2013  . LIVER FUNCTION TESTS, ABNORMAL 12/06/2007  . Hyperlipidemia LDL goal <130 11/08/2007  . DIVERTICULITIS OF COLON 11/07/2007  . ANEMIA 11/03/2007  . History of pericarditis 04/26/2007  . PLEURAL EFFUSION 04/26/2007  . OSTEOPENIA 08/18/2006  . TOBACCO USE 05/05/2006    Medications Prior to Visit Current Outpatient Prescriptions on File Prior to Visit  Medication Sig Dispense Refill  . b complex vitamins tablet Take 1 tablet by mouth daily.    . Calcium-Magnesium-Vitamin D (CALCIUM 500 PO) Take 1 tablet by mouth.    Marland Kitchen ibuprofen (ADVIL,MOTRIN) 200 MG tablet Take 200 mg by mouth every 6 (six) hours as needed.     No current facility-administered medications on file prior to visit.    Current Medications (verified) Current Outpatient Prescriptions  Medication Sig Dispense Refill  . b complex vitamins tablet Take 1 tablet by mouth daily.    . Calcium-Magnesium-Vitamin D (CALCIUM 500 PO) Take 1 tablet by mouth.    Marland Kitchen ibuprofen (ADVIL,MOTRIN) 200 MG tablet Take 200 mg by mouth every 6 (six) hours as needed.    . AMBULATORY NON FORMULARY MEDICATION Medication Name: zostavax 1 vial 0  . AMBULATORY NON FORMULARY MEDICATION Medication Name: TDAP 1 vial 0   No current facility-administered medications for this visit.     Allergies (verified) Codeine and Cortisone   PAST HISTORY  Family History History reviewed. No pertinent family  history.  Social History History  Substance Use Topics  . Smoking status: Current Every Day Smoker -- 1.00 packs/day for 25 years    Types: Cigarettes  . Smokeless tobacco: Not on file  . Alcohol Use: 0.0 oz/week    0 drink(s) per week     Are there smokers in your home (other than you)? No  Risk Factors Current exercise habits: The patient does not participate in regular exercise at present.  Dietary issues discussed: none  Cardiac risk factors: advanced age (older than 76 for men, 27 for women), dyslipidemia and smoking/ tobacco exposure.  Depression Screen (Note: if answer to either of the following is "Yes", a more complete depression screening is indicated)   Over the past two weeks, have you felt down, depressed or hopeless? No  Over the past two weeks, have you felt little interest or pleasure in doing things? No  Have you lost interest or pleasure in daily life? No  Do you often feel hopeless? No  Do you cry easily over simple problems? No  Activities of Daily Living In your present state of health, do you have any difficulty performing the following activities?:  Driving? No Managing money?  No Feeding yourself? No Getting from bed to chair? No   Climbing a flight of stairs? No Preparing food and eating?: No Bathing or showering? No Getting dressed: No Getting to the toilet? No Using the toilet:No Moving around from place to place: No In the past year have you fallen or had a near fall?:No  Are you sexually active?  No  Do you have more than one partner?  No  Hearing Difficulties: No Do you often ask people to speak up or repeat themselves? No Do you experience ringing or noises in your ears? No Do you have difficulty understanding soft or whispered voices? No   Do you feel that you have a problem with memory? No  Do you often misplace items? No  Do you feel safe at home?  Yes  Cognitive Testing  Alert? Yes  Normal Appearance?Yes  Oriented to  person? Yes  Place? Yes   Time? Yes  Recall of three objects?  Yes  Can perform simple calculations? Yes  Displays appropriate judgment?Yes  Can read the correct time from a watch face?Yes   6 CIT score of 1/28 ( normal )   Advanced Directives have been discussed with the patient? No  List the Names of Other Physician/Practitioners you currently use: 1.    Indicate any recent Medical Services you may have received from other than [facility] providers in the past year (date may be approximate).  Immunization History  Administered Date(s) Administered  . Influenza Whole 04/30/2006  . Influenza,inj,Quad PF,36+ Mos 05/14/2014  . Pneumococcal Conjugate-13 11/06/2014  . Pneumococcal Polysaccharide-23 04/30/2006    Screening Tests Health Maintenance  Topic Date Due  . TETANUS/TDAP  09/22/1967  . ZOSTAVAX  09/21/2008  . DEXA SCAN  09/21/2013  . PNA vac Low Risk Adult (1 of 2 - PCV13) 09/21/2013  . INFLUENZA VACCINE  02/18/2015  . MAMMOGRAM  11/21/2015  . COLONOSCOPY  05/18/2019  . Hepatitis C Screening  Completed    All answers were reviewed with the patient and necessary referrals were made:  Osmara Drummonds, MD   11/06/2014   History reviewed: allergies, current medications, past family history, past medical history, past social history, past surgical history and problem list  Review of Systems A comprehensive review of systems was negative.    Objective:     Vision by Snellen chart: right eye:20/25, left eye:20/25  Body mass index is 21.77 kg/(m^2). BP 127/84 mmHg  Pulse 86  Ht 5\' 7"  (1.702 m)  Wt 139 lb (63.05 kg)  BMI 21.77 kg/m2  BP 127/84 mmHg  Pulse 86  Ht 5\' 7"  (1.702 m)  Wt 139 lb (63.05 kg)  BMI 21.77 kg/m2  General Appearance:    Alert, cooperative, no distress, appears stated age  Head:    Normocephalic, without obvious abnormality, atraumatic  Eyes:    PERRL, conjunctiva/corneas clear, EOM's intact, both eyes  Ears:    Normal TM's and external ear  canals, both ears  Nose:   Nares normal, septum midline, mucosa normal, no drainage    or sinus tenderness  Throat:   Lips, mucosa, and tongue normal; teeth and gums normal  Neck:   Supple, symmetrical, trachea midline, no adenopathy;    thyroid:  no enlargement/tenderness/nodules; no carotid   bruit or JVD  Back:     Symmetric, no curvature, ROM normal, no CVA tenderness  Lungs:     Clear to auscultation bilaterally, respirations unlabored  Chest Wall:    No tenderness or deformity   Heart:    Regular rate and rhythm, S1 and S2 normal, no murmur, rub   or gallop  Breast Exam:    No tenderness, masses, or nipple abnormality  Abdomen:     Soft, non-tender, bowel sounds active all four quadrants,    no masses, no organomegaly  Genitalia:    Normal  female without lesion, discharge or tenderness, cervix is stenotic  Rectal:    Not examined  Extremities:   Extremities normal, atraumatic, no cyanosis or edema  Pulses:   2+ and symmetric all extremities  Skin:   Skin color, texture, turgor normal, no rashes or lesions  Lymph nodes:   Cervical, supraclavicular, and axillary nodes normal  Neurologic:   CNII-XII intact, normal strength, sensation and reflexes    throughout       Assessment:     Medicare Wellness Exam       Plan:     During the course of the visit the patient was educated and counseled about appropriate screening and preventive services including:    Td vaccine  Shingles vaccin   Tobacco abuse - encouarged cessation   Bone density   Pap smear performed today.   Diet review for nutrition referral? Yes ____  Not Indicated _X_   Patient Instructions (the written plan) was given to the patient.  Medicare Attestation I have personally reviewed: The patient's medical and social history Their use of alcohol, tobacco or illicit drugs Their current medications and supplements The patient's functional ability including ADLs,fall risks, home safety risks, cognitive,  and hearing and visual impairment Diet and physical activities Evidence for depression or mood disorders  The patient's weight, height, BMI, and visual acuity have been recorded in the chart.  I have made referrals, counseling, and provided education to the patient based on review of the above and I have provided the patient with a written personalized care plan for preventive services.     Charvi Gammage, MD   11/06/2014

## 2014-11-08 LAB — CYTOLOGY - PAP

## 2014-11-29 DIAGNOSIS — I788 Other diseases of capillaries: Secondary | ICD-10-CM | POA: Diagnosis not present

## 2014-12-06 ENCOUNTER — Other Ambulatory Visit: Admitting: Family Medicine

## 2014-12-19 ENCOUNTER — Encounter: Payer: Self-pay | Admitting: Family Medicine

## 2014-12-19 ENCOUNTER — Ambulatory Visit (INDEPENDENT_AMBULATORY_CARE_PROVIDER_SITE_OTHER): Payer: Medicare Other | Admitting: Family Medicine

## 2014-12-19 VITALS — BP 110/66 | HR 87 | Ht 67.0 in | Wt 137.0 lb

## 2014-12-19 DIAGNOSIS — Z23 Encounter for immunization: Secondary | ICD-10-CM

## 2014-12-19 DIAGNOSIS — J449 Chronic obstructive pulmonary disease, unspecified: Secondary | ICD-10-CM

## 2014-12-19 DIAGNOSIS — Z720 Tobacco use: Secondary | ICD-10-CM | POA: Diagnosis not present

## 2014-12-19 DIAGNOSIS — F17200 Nicotine dependence, unspecified, uncomplicated: Secondary | ICD-10-CM

## 2014-12-19 MED ORDER — ALBUTEROL SULFATE (2.5 MG/3ML) 0.083% IN NEBU
2.5000 mg | INHALATION_SOLUTION | Freq: Once | RESPIRATORY_TRACT | Status: AC
  Administered 2014-12-19: 2.5 mg via RESPIRATORY_TRACT

## 2014-12-19 MED ORDER — ALBUTEROL SULFATE HFA 108 (90 BASE) MCG/ACT IN AERS: 2.0000 | INHALATION_SPRAY | Freq: Four times a day (QID) | RESPIRATORY_TRACT | Status: DC | PRN

## 2014-12-19 NOTE — Progress Notes (Signed)
   Subjective:    Patient ID: Kristen Gay, female    DOB: 01-Oct-1950, 64 y.o.   MRN: [id number]  HPI Here today for spirometry. I recently saw her for Medi-Cal are well annual wellness exam. Because of her history of tobacco use I had recommended that she come in for spirometry for further evaluation. She denies any significant shortness of breath chest tightness or wheezing. She denies a morning cough or productive sputum. She has smoked about a quarter of a pack for well over 25 years. She used to smoke a pack a day. She has tried to cut back recently but says her husband keeps buying cigarettes.    Review of Systems     Objective:   Physical Exam  Constitutional: She is oriented to person, place, and time. She appears well-developed and well-nourished.  HENT:  Head: Normocephalic and atraumatic.  Eyes: Conjunctivae and EOM are normal.  Cardiovascular: Normal rate.   Pulmonary/Chest: Effort normal.  Neurological: She is alert and oriented to person, place, and time.  Skin: Skin is dry. No pallor.  Psychiatric: She has a normal mood and affect. Her behavior is normal.          Assessment & Plan:  Tob abuse - Encouraged cessation. She has cut back to 3-4 per day. Says her husband tends to bring them home and then she smokes them.    Spirometry - Discussed results.  FVC of 81%, FEV1 of 74% and ratio, actual of 70%. Discussed that based on guidelines she would meet criteria for mild to moderate COPD. She did not have any significant improvement postbronchodilator. I reviewed the results with her and showed her that her estimated lung age will be greater than 45 years. We discussed the importance of smoking cessation. I did encourage her to allow me to write an albuterol inhaler for her to use as needed. She says she's rarely symptomatic. I recommended that we repeat spirometry in one year. Also encouraged her to get yearly flu vaccine and avoid triggers.

## 2014-12-24 DIAGNOSIS — M25511 Pain in right shoulder: Secondary | ICD-10-CM | POA: Diagnosis not present

## 2015-01-02 DIAGNOSIS — Z1231 Encounter for screening mammogram for malignant neoplasm of breast: Secondary | ICD-10-CM | POA: Diagnosis not present

## 2015-01-24 ENCOUNTER — Encounter: Payer: Self-pay | Admitting: Family Medicine

## 2015-04-30 DIAGNOSIS — M3320 Polymyositis, organ involvement unspecified: Secondary | ICD-10-CM | POA: Diagnosis not present

## 2015-05-08 DIAGNOSIS — G5600 Carpal tunnel syndrome, unspecified upper limb: Secondary | ICD-10-CM | POA: Diagnosis not present

## 2015-11-06 ENCOUNTER — Ambulatory Visit (INDEPENDENT_AMBULATORY_CARE_PROVIDER_SITE_OTHER): Payer: Medicare Other | Admitting: Family Medicine

## 2015-11-06 ENCOUNTER — Encounter: Payer: Self-pay | Admitting: Family Medicine

## 2015-11-06 VITALS — BP 137/73 | HR 76 | Wt 140.0 lb

## 2015-11-06 DIAGNOSIS — E559 Vitamin D deficiency, unspecified: Secondary | ICD-10-CM | POA: Diagnosis not present

## 2015-11-06 DIAGNOSIS — M8580 Other specified disorders of bone density and structure, unspecified site: Secondary | ICD-10-CM

## 2015-11-06 DIAGNOSIS — Z780 Asymptomatic menopausal state: Secondary | ICD-10-CM

## 2015-11-06 DIAGNOSIS — E785 Hyperlipidemia, unspecified: Secondary | ICD-10-CM | POA: Diagnosis not present

## 2015-11-06 DIAGNOSIS — Z0000 Encounter for general adult medical examination without abnormal findings: Secondary | ICD-10-CM | POA: Diagnosis not present

## 2015-11-06 NOTE — Progress Notes (Signed)
Subjective:    Kristen Gay is a 65 y.o. female who presents for Medicare Annual/Subsequent preventive examination.  Preventive Screening-Counseling & Management  Tobacco History  Smoking status  . Current Every Day Smoker -- 0.25 packs/day for 25 years  . Types: Cigarettes  Smokeless tobacco  . Not on file    Comment: previously smoked 1ppd     Problems Prior to Visit 1.   Current Problems (verified) Patient Active Problem List   Diagnosis Date Noted  . COPD, mild ([place]) 12/19/2014  . Liver hemangioma 10/31/2013  . Bilateral renal cysts 10/31/2013  . Osteopenia 08/25/2013  . Polymyositis ([place]) 08/23/2013  . LIVER FUNCTION TESTS, ABNORMAL 12/06/2007  . Hyperlipidemia LDL goal <130 11/08/2007  . DIVERTICULITIS OF COLON 11/07/2007  . ANEMIA 11/03/2007  . History of pericarditis 04/26/2007  . PLEURAL EFFUSION 04/26/2007  . OSTEOPENIA 08/18/2006  . TOBACCO USE 05/05/2006    Medications Prior to Visit No current outpatient prescriptions on file prior to visit.   No current facility-administered medications on file prior to visit.    Current Medications (verified) Current Outpatient Prescriptions  Medication Sig Dispense Refill  . TRAMADOL HCL PO Take by mouth.     No current facility-administered medications for this visit.     Allergies (verified) Codeine and Cortisone   PAST HISTORY  Family History No family history on file.  Social History Social History  Substance Use Topics  . Smoking status: Current Every Day Smoker -- 0.25 packs/day for 25 years    Types: Cigarettes  . Smokeless tobacco: Not on file     Comment: previously smoked 1ppd  . Alcohol Use: 0.0 oz/week    0 Standard drinks or equivalent per week     Are there smokers in your home (other than you)? Yes  Risk Factors Current exercise habits: The patient does not participate in regular exercise at present.  Dietary issues discussed: hasn't been eating the most healthy foods.      Cardiac risk factors: advanced age (older than 2 for men, 60 for women), dyslipidemia, sedentary lifestyle and smoking/ tobacco exposure.  Depression Screen (Note: if answer to either of the following is "Yes", a more complete depression screening is indicated)   Over the past two weeks, have you felt down, depressed or hopeless? No  Over the past two weeks, have you felt little interest or pleasure in doing things? No  Have you lost interest or pleasure in daily life? No  Do you often feel hopeless? No  Do you cry easily over simple problems? No  Activities of Daily Living In your present state of health, do you have any difficulty performing the following activities?:  Driving? No Managing money?  No Feeding yourself? No Getting from bed to chair? No   Climbing a flight of stairs? No Preparing food and eating?: No Bathing or showering? No Getting dressed: No Getting to the toilet? No Using the toilet:No Moving around from place to place: No In the past year have you fallen or had a near fall?:No    Hearing Difficulties: No Do you often ask people to speak up or repeat themselves? No Do you experience ringing or noises in your ears? No Do you have difficulty understanding soft or whispered voices? No   Do you feel that you have a problem with memory? No  Do you often misplace items? No  Do you feel safe at home?  Yes  Cognitive Testing  Alert? Yes  Normal Appearance?Yes  Oriented to  person? Yes  Place? Yes   Time? Yes  Recall of three objects?  Yes  Can perform simple calculations? Yes  Displays appropriate judgment?Yes  Can read the correct time from a watch face?Yes    6 CIT is normal.     Advanced Directives have been discussed with the patient? Yes  List the Names of Other Physician/Practitioners you currently use: 1.    Indicate any recent Medical Services you may have received from other than [facility] providers in the past year (date may be  approximate).  Immunization History  Administered Date(s) Administered  . Influenza Whole 04/30/2006  . Influenza,inj,Quad PF,36+ Mos 05/14/2014  . Pneumococcal Conjugate-13 11/06/2014  . Pneumococcal Polysaccharide-23 04/30/2006  . Tdap 12/19/2014  . Zoster 12/19/2014    Screening Tests Health Maintenance  Topic Date Due  . DEXA SCAN  09/21/2013  . PNA vac Low Risk Adult (2 of 2 - PPSV23) 11/06/2015  . INFLUENZA VACCINE  02/18/2016  . MAMMOGRAM  01/01/2017  . COLONOSCOPY  05/18/2019  . TETANUS/TDAP  12/18/2024  . ZOSTAVAX  Completed  . Hepatitis C Screening  Completed    All answers were reviewed with the patient and necessary referrals were made:  Giabella Duhart, MD   11/06/2015   History reviewed: allergies, current medications, past family history, past medical history, past social history, past surgical history and problem list  Review of Systems A comprehensive review of systems was negative.    Objective:     Vision by Snellen chart: Upto Date. Called for report.    Body mass index is 21.92 kg/(m^2). BP 137/73 mmHg  Pulse 76  Wt 140 lb (63.504 kg)  SpO2 98%  BP 137/73 mmHg  Pulse 76  Wt 140 lb (63.504 kg)  SpO2 98% General appearance: alert, cooperative and appears stated age Head: Normocephalic, without obvious abnormality, atraumatic Eyes: conj clear, EOMI< PEERLA Ears: normal TM's and external ear canals both ears Nose: Nares normal. Septum midline. Mucosa normal. No drainage or sinus tenderness. Throat: lips, mucosa, and tongue normal; teeth and gums normal Neck: no adenopathy, no carotid bruit, no JVD, supple, symmetrical, trachea midline and thyroid not enlarged, symmetric, no tenderness/mass/nodules Back: symmetric, no curvature. ROM normal. No CVA tenderness. Lungs: clear to auscultation bilaterally Breasts: normal appearance, no masses or tenderness Heart: regular rate and rhythm, S1, S2 normal, no murmur, click, rub or gallop Abdomen:  soft, non-tender; bowel sounds normal; no masses,  no organomegaly Extremities: extremities normal, atraumatic, no cyanosis or edema Pulses: 2+ and symmetric Skin: Skin color, texture, turgor normal. No rashes or lesions Lymph nodes: Cervical, supraclavicular, and axillary nodes normal. Neurologic: Alert and oriented X 3, normal strength and tone. Normal symmetric reflexes. Normal coordination and gait     Assessment:     Medicare Wellness Exam       Plan:     During the course of the visit the patient was educated and counseled about appropriate screening and preventive services including:    Bone densitometry screening - osteopenia  Due for CMP and lipids.    Tobacco abuse - encouraged cessation.   She decline info on advanced directives.    Diet review for nutrition referral? Yes ____  Not Indicated __x__   Patient Instructions (the written plan) was given to the patient.  Medicare Attestation I have personally reviewed: The patient's medical and social history Their use of alcohol, tobacco or illicit drugs Their current medications and supplements The patient's functional ability including ADLs,fall risks, home safety risks,  cognitive, and hearing and visual impairment Diet and physical activities Evidence for depression or mood disorders  The patient's weight, height, BMI, and visual acuity have been recorded in the chart.  I have made referrals, counseling, and provided education to the patient based on review of the above and I have provided the patient with a written personalized care plan for preventive services.     Laval Cafaro, MD   11/06/2015

## 2015-11-06 NOTE — Patient Instructions (Signed)
Smoking Cessation, Tips for Success If you are ready to quit smoking, congratulations! You have chosen to help yourself be healthier. Cigarettes bring nicotine, tar, carbon monoxide, and other irritants into your body. Your lungs, heart, and blood vessels will be able to work better without these poisons. There are many different ways to quit smoking. Nicotine gum, nicotine patches, a nicotine inhaler, or nicotine nasal spray can help with physical craving. Hypnosis, support groups, and medicines help break the habit of smoking. WHAT THINGS CAN I DO TO MAKE QUITTING EASIER?  Here are some tips to help you quit for good:  Pick a date when you will quit smoking completely. Tell all of your friends and family about your plan to quit on that date.  Do not try to slowly cut down on the number of cigarettes you are smoking. Pick a quit date and quit smoking completely starting on that day.  Throw away all cigarettes.   Clean and remove all ashtrays from your home, work, and car.  On a card, write down your reasons for quitting. Carry the card with you and read it when you get the urge to smoke.  Cleanse your body of nicotine. Drink enough water and fluids to keep your urine clear or pale yellow. Do this after quitting to flush the nicotine from your body.  Learn to predict your moods. Do not let a bad situation be your excuse to have a cigarette. Some situations in your life might tempt you into wanting a cigarette.  Never have "just one" cigarette. It leads to wanting another and another. Remind yourself of your decision to quit.  Change habits associated with smoking. If you smoked while driving or when feeling stressed, try other activities to replace smoking. Stand up when drinking your coffee. Brush your teeth after eating. Sit in a different chair when you read the paper. Avoid alcohol while trying to quit, and try to drink fewer caffeinated beverages. Alcohol and caffeine may urge you to  smoke.  Avoid foods and drinks that can trigger a desire to smoke, such as sugary or spicy foods and alcohol.  Ask people who smoke not to smoke around you.  Have something planned to do right after eating or having a cup of coffee. For example, plan to take a walk or exercise.  Try a relaxation exercise to calm you down and decrease your stress. Remember, you may be tense and nervous for the first 2 weeks after you quit, but this will pass.  Find new activities to keep your hands busy. Play with a pen, coin, or rubber band. Doodle or draw things on paper.  Brush your teeth right after eating. This will help cut down on the craving for the taste of tobacco after meals. You can also try mouthwash.   Use oral substitutes in place of cigarettes. Try using lemon drops, carrots, cinnamon sticks, or chewing gum. Keep them handy so they are available when you have the urge to smoke.  When you have the urge to smoke, try deep breathing.  Designate your home as a nonsmoking area.  If you are a heavy smoker, ask your health care provider about a prescription for nicotine chewing gum. It can ease your withdrawal from nicotine.  Reward yourself. Set aside the cigarette money you save and buy yourself something nice.  Look for support from others. Join a support group or smoking cessation program. Ask someone at home or at work to help you with your plan   to quit smoking.  Always ask yourself, "Do I need this cigarette or is this just a reflex?" Tell yourself, "Today, I choose not to smoke," or "I do not want to smoke." You are reminding yourself of your decision to quit.  Do not replace cigarette smoking with electronic cigarettes (commonly called e-cigarettes). The safety of e-cigarettes is unknown, and some may contain harmful chemicals.  If you relapse, do not give up! Plan ahead and think about what you will do the next time you get the urge to smoke. HOW WILL I FEEL WHEN I QUIT SMOKING? You  may have symptoms of withdrawal because your body is used to nicotine (the addictive substance in cigarettes). You may crave cigarettes, be irritable, feel very hungry, cough often, get headaches, or have difficulty concentrating. The withdrawal symptoms are only temporary. They are strongest when you first quit but will go away within 10-14 days. When withdrawal symptoms occur, stay in control. Think about your reasons for quitting. Remind yourself that these are signs that your body is healing and getting used to being without cigarettes. Remember that withdrawal symptoms are easier to treat than the major diseases that smoking can cause.  Even after the withdrawal is over, expect periodic urges to smoke. However, these cravings are generally short lived and will go away whether you smoke or not. Do not smoke! WHAT RESOURCES ARE AVAILABLE TO HELP ME QUIT SMOKING? Your health care provider can direct you to community resources or hospitals for support, which may include:  Group support.  Education.  Hypnosis.  Therapy.   This information is not intended to replace advice given to you by your health care provider. Make sure you discuss any questions you have with your health care provider.   Document Released: 04/03/2004 Document Revised: 07/27/2014 Document Reviewed: 12/22/2012 Elsevier Interactive Patient Education 2016 [employer]  

## 2015-11-07 ENCOUNTER — Encounter: Admitting: Family Medicine

## 2015-11-07 LAB — COMPLETE METABOLIC PANEL WITH GFR
ALT: 10 U/L (ref 6–29)
AST: 19 U/L (ref 10–35)
Albumin: 4.1 g/dL (ref 3.6–5.1)
Alkaline Phosphatase: 82 U/L (ref 33–130)
BILIRUBIN TOTAL: 0.4 mg/dL (ref 0.2–1.2)
BUN: 7 mg/dL (ref 7–25)
CO2: 24 mmol/L (ref 20–31)
CREATININE: 0.6 mg/dL (ref 0.50–0.99)
Calcium: 9.4 mg/dL (ref 8.6–10.4)
Chloride: 102 mmol/L (ref 98–110)
GFR, Est African American: >89 mL/min (ref >=60)
GFR, Est Non African American: >89 mL/min (ref >=60)
GLUCOSE: 84 mg/dL (ref 65–99)
Potassium: 4.5 mmol/L (ref 3.5–5.3)
SODIUM: 139 mmol/L (ref 135–146)
TOTAL PROTEIN: 7.8 g/dL (ref 6.1–8.1)

## 2015-11-07 LAB — LIPID PANEL
Cholesterol: 231 mg/dL — ABNORMAL HIGH (ref 125–200)
HDL: 28 mg/dL — ABNORMAL LOW (ref >=46)
LDL CALC: 165 mg/dL — AB (ref <130)
TRIGLYCERIDES: 192 mg/dL — AB (ref <150)
Total CHOL/HDL Ratio: 8.3 Ratio — ABNORMAL HIGH (ref <=5.0)
VLDL: 38 mg/dL — AB (ref <30)

## 2015-11-07 LAB — VITAMIN D 25 HYDROXY (VIT D DEFICIENCY, FRACTURES): VIT D 25 HYDROXY: 14 ng/mL — AB (ref 30–100)

## 2015-11-08 NOTE — Addendum Note (Signed)
Addended by: Teddy Spike on: 11/08/2015 09:41 AM   Modules accepted: Orders

## 2015-11-11 ENCOUNTER — Telehealth: Payer: Self-pay | Admitting: *Deleted

## 2015-11-11 ENCOUNTER — Other Ambulatory Visit: Payer: Self-pay

## 2015-11-11 MED ORDER — ATORVASTATIN CALCIUM 20 MG PO TABS: 20.0000 mg | ORAL_TABLET | Freq: Every day | ORAL | Status: DC

## 2015-11-11 NOTE — Addendum Note (Signed)
Addended by: Beatrice Lecher D on: 11/11/2015 09:06 AM   Modules accepted: Orders, SmartSet

## 2015-11-11 NOTE — Telephone Encounter (Signed)
Patient would like to only pick up 3 month supply of medication of lipitor at pharm and have a 90 day supply sent to [facility]. The husband will come by to pick paper  rx up later today

## 2016-02-13 ENCOUNTER — Encounter: Payer: Self-pay | Admitting: Osteopathic Medicine

## 2016-02-13 ENCOUNTER — Ambulatory Visit (INDEPENDENT_AMBULATORY_CARE_PROVIDER_SITE_OTHER): Payer: Medicare Other | Admitting: Osteopathic Medicine

## 2016-02-13 VITALS — BP 150/75 | HR 75 | Temp 98.3°F | Ht 66.5 in | Wt 136.0 lb

## 2016-02-13 DIAGNOSIS — F17200 Nicotine dependence, unspecified, uncomplicated: Secondary | ICD-10-CM | POA: Diagnosis not present

## 2016-02-13 DIAGNOSIS — K1329 Other disturbances of oral epithelium, including tongue: Secondary | ICD-10-CM

## 2016-02-13 DIAGNOSIS — J029 Acute pharyngitis, unspecified: Secondary | ICD-10-CM | POA: Diagnosis not present

## 2016-02-13 LAB — POCT RAPID STREP A (OFFICE): RAPID STREP A SCREEN: NEGATIVE

## 2016-02-13 MED ORDER — LIDOCAINE VISCOUS HCL 2 % MT SOLN: 15.0000 mL | OROMUCOSAL | 0 refills | Status: DC | PRN

## 2016-02-13 MED ORDER — CHLORHEXIDINE GLUCONATE 0.12% ORAL RINSE (MEDLINE KIT): 10.0000 mL | Freq: Two times a day (BID) | OROMUCOSAL | 0 refills | Status: DC

## 2016-02-13 MED ORDER — METHYLPREDNISOLONE 4 MG PO TBPK: ORAL_TABLET | ORAL | 0 refills | Status: DC

## 2016-02-13 MED ORDER — PENICILLIN V POTASSIUM 500 MG PO TABS: 500.0000 mg | ORAL_TABLET | Freq: Two times a day (BID) | ORAL | 0 refills | Status: DC

## 2016-02-13 NOTE — Patient Instructions (Signed)

## 2016-02-13 NOTE — Progress Notes (Signed)
HPI: Kristen Gay is a 65 y.o. female who presents to [facility]  today for chief complaint of:  Chief Complaint  Patient presents with  . Sore Throat    . Context: No sick contacts she is aware of . Location: Sore throat initially worse on the left side but now seems to be more cricothyroid area . Quality: Sore, scratchy . Assoc signs/symptoms: see ROS . Duration: 7 days . Modifying factors: has tried the following OTC/Rx medications: has tried Mucinex, menthol, garlge unknown brand. Patient is a smoker.   Past medical, social and family history reviewed. Current medications and allergies reviewed.     Review of Systems: CONSTITUTIONAL: no fever/chills HEAD/EYES/EARS/NOSE/THROAT: no headache, no vision change or hearing change, yes sore throat CARDIAC: No chest pain, No pressure/palpitations RESPIRATORY: no cough, no shortness of breath GASTROINTESTINAL: no nausea, no vomiting, no abdominal pain, no diarrhea MUSCULOSKELETAL: no myalgia/arthralgia SKIN: no rash   Exam:  BP (!) 150/75   Pulse 75   Temp 98.3 F (36.8 C) (Oral)   Ht 5' 6.5" (1.689 m)   Wt 136 lb (61.7 kg)   BMI 21.62 kg/m  Constitutional: VSS, see above. General Appearance: alert, well-developed, well-nourished, NAD Eyes: Normal lids and conjunctive, non-icteric sclera, PERRLA Ears, Nose, Mouth, Throat: Normal external inspection ears/nares/mouth/lips/gums, normal TM, MMM;       posterior pharynx with erythema, without exudate, nasal mucosa normal, (+) whitish/yellow plaque on tongue, no mouth ulceration Neck: No masses, trachea midline. normal lymph nodes Respiratory: Normal respiratory effort. No  wheeze/rhonchi/rales Cardiovascular: S1/S2 normal, no murmur/rub/gallop auscultated..RRR.   Results for orders placed or performed in visit on 02/13/16 (from the past 72 hour(s))  POCT rapid strep A     Status: None   Collection Time: 02/13/16 10:00 AM  Result Value Ref  Range   Rapid Strep A Screen Negative Negative   MODIFIED CENTOR CRITERIA (ponts if "yes"): Tonsillar exudate (1): no Tender Ant Cervical LN (1): no Absence of cough (1): yes Fever (1): no Age  49-14 (1): no 15-45 (0): no >/= 45 (-1): yes SCORE: 0    ASSESSMENT/PLAN:   Most likely viral pharyngitis, she is right at the 7 day mark where should be starting to get better, and patient does note that her voice is returning a little bit today as opposed to the past few days. She's been doing pretty well in terms of supportive care, neck pain is a little concerning however no tonsillar exudate/significant erythema, no enlarged lymph nodes, will trial steroid taper, lidocaine gargle for symptomatic relief, patient also requests mouthwash for her plaque on the tongue,   patient given written prescription for penicillin if steroid pack and other symptomatic measures are not working, that low suspicion that this is strep given Centor criteria however isolation of only pharyngitis symptoms without any other sinus/upper respiratory issues or cough  Viral pharyngitis - Plan: methylPREDNISolone (MEDROL DOSEPAK) 4 MG TBPK tablet, Lidocaine HCl 2 % SOLN  Sore throat - Plan: POCT rapid strep A, penicillin v potassium (VEETID) 500 MG tablet  Tongue plaque - Plan: chlorhexidine gluconate, SAGE KIT, (PERIDEX) 0.12 % solution  TOBACCO USE    Visit summary was printed for the patient with medications and pertinent instructions for patient to review. ER/RTC precautions reviewed. All questions answered. Return if symptoms worsen or fail to improve.

## 2016-03-03 ENCOUNTER — Other Ambulatory Visit: Payer: Self-pay | Admitting: *Deleted

## 2016-03-03 DIAGNOSIS — E559 Vitamin D deficiency, unspecified: Secondary | ICD-10-CM | POA: Diagnosis not present

## 2016-03-03 DIAGNOSIS — M8580 Other specified disorders of bone density and structure, unspecified site: Secondary | ICD-10-CM | POA: Diagnosis not present

## 2016-03-03 DIAGNOSIS — E785 Hyperlipidemia, unspecified: Secondary | ICD-10-CM

## 2016-03-04 ENCOUNTER — Other Ambulatory Visit: Payer: Self-pay | Admitting: Family Medicine

## 2016-03-04 LAB — LIPID PANEL
CHOLESTEROL: 176 mg/dL (ref 125–200)
HDL: 35 mg/dL — ABNORMAL LOW (ref >=46)
LDL Cholesterol: 102 mg/dL (ref <130)
TRIGLYCERIDES: 195 mg/dL — AB (ref <150)
Total CHOL/HDL Ratio: 5 Ratio (ref <=5.0)
VLDL: 39 mg/dL — AB (ref <30)

## 2016-03-04 LAB — VITAMIN D 25 HYDROXY (VIT D DEFICIENCY, FRACTURES): Vit D, 25-Hydroxy: 51 ng/mL (ref 30–100)

## 2016-03-11 DIAGNOSIS — Z1231 Encounter for screening mammogram for malignant neoplasm of breast: Secondary | ICD-10-CM | POA: Diagnosis not present

## 2016-03-11 LAB — HM MAMMOGRAPHY

## 2016-04-09 ENCOUNTER — Encounter: Payer: Self-pay | Admitting: Family Medicine

## 2016-04-21 DIAGNOSIS — H43813 Vitreous degeneration, bilateral: Secondary | ICD-10-CM | POA: Diagnosis not present

## 2016-05-14 DIAGNOSIS — H40013 Open angle with borderline findings, low risk, bilateral: Secondary | ICD-10-CM | POA: Diagnosis not present

## 2016-10-29 ENCOUNTER — Emergency Department (INDEPENDENT_AMBULATORY_CARE_PROVIDER_SITE_OTHER)
Admission: EM | Admit: 2016-10-29 | Discharge: 2016-10-29 | Disposition: A | Discharge status: Home / Self Care | Payer: Medicare Other | Source: Home / Self Care | Attending: Family Medicine | Admitting: Family Medicine

## 2016-10-29 ENCOUNTER — Emergency Department (INDEPENDENT_AMBULATORY_CARE_PROVIDER_SITE_OTHER): Payer: Medicare Other

## 2016-10-29 DIAGNOSIS — M19041 Primary osteoarthritis, right hand: Secondary | ICD-10-CM

## 2016-10-29 DIAGNOSIS — M25531 Pain in right wrist: Secondary | ICD-10-CM | POA: Diagnosis not present

## 2016-10-29 DIAGNOSIS — M79641 Pain in right hand: Secondary | ICD-10-CM

## 2016-10-29 DIAGNOSIS — S6991XA Unspecified injury of right wrist, hand and finger(s), initial encounter: Secondary | ICD-10-CM | POA: Diagnosis not present

## 2016-10-29 DIAGNOSIS — G8929 Other chronic pain: Secondary | ICD-10-CM | POA: Diagnosis not present

## 2016-10-29 DIAGNOSIS — G5601 Carpal tunnel syndrome, right upper limb: Secondary | ICD-10-CM

## 2016-10-29 HISTORY — DX: Pure hypercholesterolemia, unspecified: E78.00

## 2016-10-29 MED ORDER — MELOXICAM 7.5 MG PO TABS: 7.5000 mg | ORAL_TABLET | Freq: Every day | ORAL | 0 refills | Status: DC

## 2016-10-29 NOTE — ED Triage Notes (Signed)
Pt woke up a couple of weeks ago and had hand pain, swelling and redness.  Is wearing a brace on the hand at present time.

## 2016-10-29 NOTE — Discharge Instructions (Signed)
°  Meloxicam (Mobic) is an antiinflammatory to help with pain and inflammation.  Do not take ibuprofen, Advil, Aleve, or any other medications that contain NSAIDs while taking meloxicam as this may cause stomach upset or even ulcers if taken in large amounts for an extended period of time.  ° °

## 2016-10-29 NOTE — ED Provider Notes (Signed)
CSN: [id number]     Arrival date & time 10/29/16  1224 History   First MD Initiated Contact with Patient 10/29/16 1253     Chief Complaint  Patient presents with  . Hand Pain    right   (Consider location/radiation/quality/duration/timing/severity/associated sxs/prior Treatment) HPI  Kristen Gay is a 66 y.o. female presenting to UC with c/o Right wrist and hand stiffness and soreness that started about 3 weeks ago.  She reports a hx of multiple hand and wrist surgeries in [place] for a worker's comp related injury, last surgery was in about 2011/2012.  She noticed significant swelling and redness about 3 weeks ago that gradually improved over 4-5 days with use of ice and ibuprofen.  Since then soreness and stiffness, especially in the morning, has continued.  She does not recall any new injury.  Pain is 7/10, aching.   She is Right hand dominant but tries to be aware of not picking up anything heavy or doing activities that would aggravate her pain. She has not f/u with her PCP for symptoms and does not have an orthopedist or sports medicine provider in the area.     Past Medical History:  Diagnosis Date  . Hypercholesteremia    Past Surgical History:  Procedure Laterality Date  . APPENDECTOMY    . carpel tunnel release Bilateral    History reviewed. No pertinent family history. Social History  Substance Use Topics  . Smoking status: Current Every Day Smoker    Packs/day: 0.25    Years: 25.00    Types: Cigarettes  . Smokeless tobacco: Not on file     Comment: previously smoked 1ppd  . Alcohol use 0.0 oz/week   OB History    No data available     Review of Systems  Constitutional: Negative for chills and fever.  Musculoskeletal: Positive for arthralgias, joint swelling and myalgias.  Skin: Negative for color change and wound.  Neurological: Positive for weakness and numbness.    Allergies  Codeine and Cortisone  Home Medications   Prior to Admission medications    Medication Sig Start Date End Date Taking? Authorizing Provider  atorvastatin (LIPITOR) 20 MG tablet Take 1 tablet (20 mg total) by mouth daily. 11/11/15   Hali Marry, MD  chlorhexidine gluconate, SAGE KIT, (PERIDEX) 0.12 % solution Use as directed 10 mLs in the mouth or throat 2 (two) times daily. 02/13/16   Emeterio Reeve, DO  Lidocaine HCl 2 % SOLN Use as directed 15 mLs in the mouth or throat every 3 (three) hours as needed (mouth/throat pain - gargle and spit). 02/13/16   Emeterio Reeve, DO  meloxicam (MOBIC) 7.5 MG tablet Take 1-2 tablets (7.5-15 mg total) by mouth daily. For 5 days, then daily as needed for pain 10/29/16   Noland Fordyce, PA-C  penicillin v potassium (VEETID) 500 MG tablet Take 1 tablet (500 mg total) by mouth 2 (two) times daily. Fill if no better after 2 - 3 days on the steroid pack 02/13/16   Emeterio Reeve, DO   Meds Ordered and Administered this Visit  Medications - No data to display  BP 132/77 (BP Location: Left Arm)   Pulse 76   Temp 98.1 F (36.7 C) (Oral)   Ht 5' 6"  (1.676 m)   Wt 128 lb (58.1 kg)   BMI 20.66 kg/m  No data found.   Physical Exam  Constitutional: She is oriented to person, place, and time. She appears well-developed and well-nourished. No distress.  HENT:  Head: Normocephalic and atraumatic.  Eyes: EOM are normal.  Neck: Normal range of motion.  Cardiovascular: Normal rate.   Pulses:      Radial pulses are 2+ on the right side.  Pulmonary/Chest: Effort normal.  Musculoskeletal: She exhibits edema and tenderness.  Right wrist and hand: mild edema to dorsal aspect of hand.  Tender. Slight decreased ROM in hand, unable to make a full first due to pain in hand.  Forearm: mildly tender. Muscle compartments are soft.   Neurological: She is alert and oriented to person, place, and time.  Skin: Skin is warm and dry. Capillary refill takes less than 2 seconds. She is not diaphoretic. No erythema.  Right hand and wrist:  multiple well healed scars c/w hx of multiple hand and wrist surgeries. Skin in tact. No ecchymosis or erythema.   Psychiatric: She has a normal mood and affect. Her behavior is normal.  Nursing note and vitals reviewed.   Urgent Care Course     Procedures (including critical care time)  Labs Review Labs Reviewed - No data to display  Imaging Review Dg Wrist Complete Right  Result Date: 10/29/2016 CLINICAL DATA:  Right wrist and hand pain for 2 weeks. EXAM: RIGHT WRIST - COMPLETE 3+ VIEW COMPARISON:  None. FINDINGS: No fracture deformity or wrist degeneration. Possible erosive findings described separately on hand study. Ulnar positive variance. There is irregularity of the distal ulna but no abutment changes in the lunate. IMPRESSION: No acute finding. Ulnar positive variance. The distal ulna is irregular but there are no abutment changes in the lunate. Electronically Signed   By: Monte Fantasia M.D.   On: 10/29/2016 13:29   Dg Hand Complete Right  Result Date: 10/29/2016 CLINICAL DATA:  Right hand pain beginning 2 weeks ago. No known injury. EXAM: RIGHT HAND - COMPLETE 3+ VIEW COMPARISON:  None. FINDINGS: There is no evidence of fracture or dislocation. Possible erosive changes at the base of the fifth proximal phalanx and metacarpal. Mild second MCP joint narrowing without notable spurring. Question previous trauma in this area given mildly distorted appearance of the distal second metacarpal. Mild ulnar positive variance. The distal ulna is mildly irregular but there is no abutment changes in the lunate. IMPRESSION: 1. Possible marginal erosions at the fifth ray. Correlate for inflammatory arthropathic symptoms. 2. No acute fracture. 3. Fifth DIP osteoarthritis. Electronically Signed   By: Monte Fantasia M.D.   On: 10/29/2016 13:27      MDM   1. Chronic hand pain, right   2. Right hand pain   3. Carpal tunnel syndrome of right wrist    Pt c/o worsening stiffness and soreness in  Right hand and wrist w/o known injury.    Plain films: No acute findings.  There is possible erosions at fifth phalanx. Pt non-tender here.   Reassured pt of no new findings on imaging but strongly encouraged f/u with PCP for help with establishing care with Sports Medicine or Orthopedist. Resource guide provided.  May continue to wear splint as needed. May try triage of Meloxicam for 5 days instead of ibuprofen. Pt agreeable.     Noland Fordyce, PA-C 10/29/16 1354  2:27 PM pt initially requested medication, meloxicam, to be sent to [facility] in [place] but just called back requesting medication be sent to her mail order pharmacy on file. Advised she may not get the medication for about 1 week. Pt understanding of this.      Noland Fordyce, PA-C 10/29/16 1428

## 2016-10-31 ENCOUNTER — Telehealth: Payer: Self-pay | Admitting: Emergency Medicine

## 2016-10-31 NOTE — Telephone Encounter (Signed)
Spoke with patient she states she is doing ok. She states that she does still have some discomfort but has made some improvements.

## 2016-11-09 ENCOUNTER — Encounter: Payer: Self-pay | Admitting: Emergency Medicine

## 2016-11-09 ENCOUNTER — Emergency Department (INDEPENDENT_AMBULATORY_CARE_PROVIDER_SITE_OTHER)
Admission: EM | Admit: 2016-11-09 | Discharge: 2016-11-09 | Disposition: A | Discharge status: Home / Self Care | Payer: Medicare Other | Source: Home / Self Care | Attending: Emergency Medicine | Admitting: Emergency Medicine

## 2016-11-09 DIAGNOSIS — H6123 Impacted cerumen, bilateral: Secondary | ICD-10-CM | POA: Diagnosis not present

## 2016-11-09 NOTE — ED Triage Notes (Signed)
Pt c/o right ear fullness and decreased hearing x1 week.

## 2016-11-09 NOTE — ED Provider Notes (Signed)
[facility]    CSN: [id number] Arrival date & time: 11/09/16  0955     History   Chief Complaint Chief Complaint  Patient presents with  . Ear Fullness    HPI Kristen Gay is a 66 y.o. female.   HPI 2 weeks of right ear feeling clogged with decreased hearing. Also symptoms to a lesser extent left ear. Associated symptoms, decreased hearing right ear. No fever or chills or URI symptoms. No drainage or bleeding. Denies inserting Q-tips in her ears. She tried flushing her ears at home without success. Past Medical History:  Diagnosis Date  . Hypercholesteremia     Patient Active Problem List   Diagnosis Date Noted  . COPD, mild ([place]) 12/19/2014  . Liver hemangioma 10/31/2013  . Bilateral renal cysts 10/31/2013  . Osteopenia 08/25/2013  . Polymyositis ([place]) 08/23/2013  . LIVER FUNCTION TESTS, ABNORMAL 12/06/2007  . Hyperlipidemia LDL goal <130 11/08/2007  . DIVERTICULITIS OF COLON 11/07/2007  . ANEMIA 11/03/2007  . History of pericarditis 04/26/2007  . PLEURAL EFFUSION 04/26/2007  . OSTEOPENIA 08/18/2006  . TOBACCO USE 05/05/2006    Past Surgical History:  Procedure Laterality Date  . APPENDECTOMY    . carpel tunnel release Bilateral     OB History    No data available       Home Medications    Prior to Admission medications   Medication Sig Start Date End Date Taking? Authorizing Provider  atorvastatin (LIPITOR) 20 MG tablet Take 1 tablet (20 mg total) by mouth daily. 11/11/15   Hali Marry, MD  chlorhexidine gluconate, SAGE KIT, (PERIDEX) 0.12 % solution Use as directed 10 mLs in the mouth or throat 2 (two) times daily. 02/13/16   Emeterio Reeve, DO  Lidocaine HCl 2 % SOLN Use as directed 15 mLs in the mouth or throat every 3 (three) hours as needed (mouth/throat pain - gargle and spit). 02/13/16   Emeterio Reeve, DO  meloxicam (MOBIC) 7.5 MG tablet Take 1-2 tablets (7.5-15 mg total) by mouth daily. For 5 days, then daily as  needed for pain 10/29/16   Noland Fordyce, PA-C  penicillin v potassium (VEETID) 500 MG tablet Take 1 tablet (500 mg total) by mouth 2 (two) times daily. Fill if no better after 2 - 3 days on the steroid pack 02/13/16   Emeterio Reeve, DO    Family History History reviewed. No pertinent family history.  Social History Social History  Substance Use Topics  . Smoking status: Current Every Day Smoker    Packs/day: 0.25    Years: 25.00    Types: Cigarettes  . Smokeless tobacco: Never Used     Comment: previously smoked 1ppd  . Alcohol use 0.0 oz/week     Allergies   Codeine and Cortisone   Review of Systems Review of Systems  All other systems reviewed and are negative.    Physical Exam Triage Vital Signs ED Triage Vitals  Enc Vitals Group     BP 11/09/16 1010 (!) 144/79     Pulse Rate 11/09/16 1010 90     Resp --      Temp 11/09/16 1010 98.9 F (37.2 C)     Temp Source 11/09/16 1010 Oral     SpO2 11/09/16 1010 97 %     Weight 11/09/16 1011 143 lb (64.9 kg)     Height --      Head Circumference --      Peak Flow --  Pain Score 11/09/16 1012 0     Pain Loc --      Pain Edu? --      Excl. in [place]? --    No data found.   Updated Vital Signs BP (!) 144/79 (BP Location: Right Arm)   Pulse 90   Temp 98.9 F (37.2 C) (Oral)   Wt 143 lb (64.9 kg)   SpO2 97%   BMI 23.08 kg/m   Visual Acuity Right Eye Distance:   Left Eye Distance:   Bilateral Distance:    Right Eye Near:   Left Eye Near:    Bilateral Near:     Physical Exam  Constitutional: She is oriented to person, place, and time. She appears well-developed and well-nourished. No distress.  HENT:  Head: Normocephalic and atraumatic.  Mouth/Throat: Oropharynx is clear and moist.  Right ear. Nontender. Dense cerumen impaction.  Left ear:  Nontender. Dense cerumen impaction.  Eyes: Pupils are equal, round, and reactive to light. No scleral icterus.  Neck: Normal range of motion. Neck supple.    Cardiovascular: Normal rate and regular rhythm.   Pulmonary/Chest: Effort normal.  Abdominal: She exhibits no distension.  Neurological: She is alert and oriented to person, place, and time. No cranial nerve deficit.  Skin: Skin is warm and dry.  Psychiatric: She has a normal mood and affect. Her behavior is normal.  Vitals reviewed.    UC Treatments / Results  Labs (all labs ordered are listed, but only abnormal results are displayed) Labs Reviewed - No data to display  EKG  EKG Interpretation None       Radiology No results found.  Procedures Procedures (including critical care time)  Medications Ordered in UC Medications - No data to display   Initial Impression / Assessment and Plan / UC Course  I have reviewed the triage vital signs and the nursing notes.  Pertinent labs & imaging results that were available during my care of the patient were reviewed by me and considered in my medical decision making (see chart for details).      Final Clinical Impressions(s) / UC Diagnoses   Final diagnoses:  Bilateral impacted cerumen  Irrigated both ears, opiates amounts of cerumen removed by irrigation. She tolerated this well. Afterward, I rechecked ears. Ear canals are clear without any sign of infection or drainage. TMs normal. She could hear normally and the symptoms of clogged ears resolved, and she expressed her thanks because of the remarkable improvement.  Other advice given. Follow-up with PCP or ENT prn     Jacqulyn Cane, MD 11/11/16 1601

## 2017-02-08 ENCOUNTER — Other Ambulatory Visit: Payer: Self-pay

## 2017-03-10 ENCOUNTER — Emergency Department (INDEPENDENT_AMBULATORY_CARE_PROVIDER_SITE_OTHER): Payer: Medicare Other

## 2017-03-10 ENCOUNTER — Encounter: Payer: Self-pay | Admitting: Emergency Medicine

## 2017-03-10 ENCOUNTER — Emergency Department (INDEPENDENT_AMBULATORY_CARE_PROVIDER_SITE_OTHER)
Admission: EM | Admit: 2017-03-10 | Discharge: 2017-03-10 | Disposition: A | Discharge status: Home / Self Care | Payer: Medicare Other | Source: Home / Self Care | Attending: Family Medicine | Admitting: Family Medicine

## 2017-03-10 DIAGNOSIS — J449 Chronic obstructive pulmonary disease, unspecified: Secondary | ICD-10-CM

## 2017-03-10 DIAGNOSIS — J189 Pneumonia, unspecified organism: Secondary | ICD-10-CM | POA: Diagnosis not present

## 2017-03-10 DIAGNOSIS — J441 Chronic obstructive pulmonary disease with (acute) exacerbation: Secondary | ICD-10-CM

## 2017-03-10 DIAGNOSIS — R05 Cough: Secondary | ICD-10-CM | POA: Diagnosis not present

## 2017-03-10 DIAGNOSIS — J8410 Pulmonary fibrosis, unspecified: Secondary | ICD-10-CM

## 2017-03-10 MED ORDER — BENZONATATE 100 MG PO CAPS: 100.0000 mg | ORAL_CAPSULE | Freq: Three times a day (TID) | ORAL | 0 refills | Status: DC

## 2017-03-10 MED ORDER — ALBUTEROL SULFATE HFA 108 (90 BASE) MCG/ACT IN AERS: 1.0000 | INHALATION_SPRAY | Freq: Four times a day (QID) | RESPIRATORY_TRACT | 0 refills | Status: DC | PRN

## 2017-03-10 MED ORDER — AZITHROMYCIN 250 MG PO TABS: 250.0000 mg | ORAL_TABLET | Freq: Every day | ORAL | 0 refills | Status: DC

## 2017-03-10 NOTE — ED Provider Notes (Signed)
[name]    CSN: [id number] Arrival date & time: 03/10/17  1155     History   Chief Complaint Chief Complaint  Patient presents with  . Cough    HPI Kristen Gay is a 66 y.o. female.   HPI Kristen Gay is a 66 y.o. female presenting to UC with c/o 1 week congestion that has gradually improved however her cough has lingered and seems to be worse in the morning while lying down.  Denies fever, chills, n/v/d. She has taken OTC cough medications with minimal relief. PMH shower hx of mild COPD. Pt does not have an inhaler she uses.  Last time she had an inhaler was about 10 years ago when she had pneumonia. Denies fever, chills, n/v/d. No known sick contacts.    Past Medical History:  Diagnosis Date  . Hypercholesteremia     Patient Active Problem List   Diagnosis Date Noted  . COPD, mild ([place]) 12/19/2014  . Liver hemangioma 10/31/2013  . Bilateral renal cysts 10/31/2013  . Osteopenia 08/25/2013  . Polymyositis ([place]) 08/23/2013  . LIVER FUNCTION TESTS, ABNORMAL 12/06/2007  . Hyperlipidemia LDL goal <130 11/08/2007  . DIVERTICULITIS OF COLON 11/07/2007  . ANEMIA 11/03/2007  . History of pericarditis 04/26/2007  . PLEURAL EFFUSION 04/26/2007  . OSTEOPENIA 08/18/2006  . TOBACCO USE 05/05/2006    Past Surgical History:  Procedure Laterality Date  . APPENDECTOMY    . carpel tunnel release Bilateral     OB History    No data available       Home Medications    Prior to Admission medications   Medication Sig Start Date End Date Taking? Authorizing Provider  albuterol (PROVENTIL HFA;VENTOLIN HFA) 108 (90 Base) MCG/ACT inhaler Inhale 1-2 puffs into the lungs every 6 (six) hours as needed for wheezing or shortness of breath. 03/10/17   Noe Gens, PA-C  atorvastatin (LIPITOR) 20 MG tablet Take 1 tablet (20 mg total) by mouth daily. 11/11/15   Hali Marry, MD  azithromycin (ZITHROMAX) 250 MG tablet Take 1 tablet (250 mg total) by mouth daily.  Take first 2 tablets together, then 1 every day until finished. 03/10/17   Noe Gens, PA-C  benzonatate (TESSALON) 100 MG capsule Take 1-2 capsules (100-200 mg total) by mouth every 8 (eight) hours. 03/10/17   Noe Gens, PA-C  chlorhexidine gluconate, SAGE KIT, (PERIDEX) 0.12 % solution Use as directed 10 mLs in the mouth or throat 2 (two) times daily. 02/13/16   Emeterio Reeve, DO    Family History History reviewed. No pertinent family history.  Social History Social History  Substance Use Topics  . Smoking status: Current Every Day Smoker    Packs/day: 0.25    Years: 25.00    Types: Cigarettes  . Smokeless tobacco: Never Used     Comment: previously smoked 1ppd  . Alcohol use 0.0 oz/week     Allergies   Codeine and Cortisone   Review of Systems Review of Systems  Constitutional: Negative for chills and fever.  HENT: Positive for congestion. Negative for ear pain, sore throat, trouble swallowing and voice change.   Respiratory: Positive for cough. Negative for shortness of breath.   Cardiovascular: Negative for chest pain and palpitations.  Gastrointestinal: Negative for abdominal pain, diarrhea, nausea and vomiting.  Musculoskeletal: Negative for arthralgias, back pain and myalgias.  Skin: Negative for rash.     Physical Exam Triage Vital Signs ED Triage Vitals  Enc Vitals Group  BP 03/10/17 1217 102/68     Pulse Rate 03/10/17 1217 (!) 105     Resp --      Temp 03/10/17 1217 97.7 F (36.5 C)     Temp Source 03/10/17 1217 Oral     SpO2 03/10/17 1217 96 %     Weight 03/10/17 1219 136 lb (61.7 kg)     Height 03/10/17 1219 5' 6" (1.676 m)     Head Circumference --      Peak Flow --      Pain Score 03/10/17 1219 0     Pain Loc --      Pain Edu? --      Excl. in [place]? --    No data found.   Updated Vital Signs BP 102/68 (BP Location: Left Arm)   Pulse (!) 105   Temp 97.7 F (36.5 C) (Oral)   Ht 5' 6" (1.676 m)   Wt 136 lb (61.7 kg)   SpO2 96%    BMI 21.95 kg/m   Physical Exam  Constitutional: She is oriented to person, place, and time. She appears well-developed and well-nourished. No distress.  HENT:  Head: Normocephalic and atraumatic.  Right Ear: Tympanic membrane normal.  Left Ear: Tympanic membrane normal.  Nose: Mucosal edema present. Right sinus exhibits no maxillary sinus tenderness and no frontal sinus tenderness. Left sinus exhibits no maxillary sinus tenderness and no frontal sinus tenderness.  Mouth/Throat: Uvula is midline, oropharynx is clear and moist and mucous membranes are normal.  Eyes: EOM are normal.  Neck: Normal range of motion.  Cardiovascular: Normal rate and regular rhythm.   Pulmonary/Chest: Effort normal. No respiratory distress. She has wheezes in the right lower field and the left lower field. She has rhonchi in the right lower field and the left lower field. She has no rales.  Musculoskeletal: Normal range of motion.  Neurological: She is alert and oriented to person, place, and time.  Skin: Skin is warm and dry. She is not diaphoretic.  Psychiatric: She has a normal mood and affect. Her behavior is normal.  Nursing note and vitals reviewed.    UC Treatments / Results  Labs (all labs ordered are listed, but only abnormal results are displayed) Labs Reviewed - No data to display  EKG  EKG Interpretation None       Radiology Dg Chest 2 View  Result Date: 03/10/2017 CLINICAL DATA:  One week of cough with wheezing and rhonchi in the lower lung fields bilaterally. History of COPD, current smoker. EXAM: CHEST  2 VIEW COMPARISON:  Report of a chest x-ray dated April 15, 2010. No previous chest images are available for review. FINDINGS: The lungs remain hyperinflated with hemidiaphragm flattening. There are coarse lung markings at both bases greatest on the right. The heart and pulmonary vascularity are normal. The mediastinum is normal in width. The bony thorax exhibits no acute  abnormality. IMPRESSION: COPD. Bibasilar fibrosis. One cannot exclude superimposed atelectasis or pneumonia at the lung bases given the lack of any previous studies for review. Followup PA and lateral chest X-ray is recommended in 3-4 weeks following trial of antibiotic therapy to ensure resolution and exclude underlying malignancy. Electronically Signed   By: David  Martinique M.D.   On: 03/10/2017 13:00    Procedures Procedures (including critical care time)  Medications Ordered in UC Medications - No data to display   Initial Impression / Assessment and Plan / UC Course  I have reviewed the triage vital signs and the  nursing notes.  Pertinent labs & imaging results that were available during my care of the patient were reviewed by me and considered in my medical decision making (see chart for details).     Hx and exam c/w COPD exacerbation secondary to early pneumonia.  Will cover with Azithromycin.  Final Clinical Impressions(s) / UC Diagnoses   Final diagnoses:  Community acquired pneumonia, unspecified laterality  COPD exacerbation ([place])   Home care instructions provided Encouraged f/u with PCP in 1 week if not improving. Provided pt with copy of CXR Impression as pt was unaware she had COPD. Discussed recommendation for repeat CXR in 3-4 weeks.  Pt agreeable.   New Prescriptions Discharge Medication List as of 03/10/2017  1:11 PM    START taking these medications   Details  albuterol (PROVENTIL HFA;VENTOLIN HFA) 108 (90 Base) MCG/ACT inhaler Inhale 1-2 puffs into the lungs every 6 (six) hours as needed for wheezing or shortness of breath., Starting Wed 03/10/2017, Normal    azithromycin (ZITHROMAX) 250 MG tablet Take 1 tablet (250 mg total) by mouth daily. Take first 2 tablets together, then 1 every day until finished., Starting Wed 03/10/2017, Normal    benzonatate (TESSALON) 100 MG capsule Take 1-2 capsules (100-200 mg total) by mouth every 8 (eight) hours., Starting Wed  03/10/2017, Normal         Controlled Substance Prescriptions [place] Controlled Substance Registry consulted? Not Applicable   Tyrell Antonio 03/10/17 1408

## 2017-03-10 NOTE — ED Triage Notes (Signed)
Cough, congestion x 1 week. She had a cold but the cough continues.

## 2017-03-10 NOTE — Discharge Instructions (Signed)
°  You may take 500mg acetaminophen every 4-6 hours or in combination with ibuprofen 400-600mg every 6-8 hours as needed for pain, inflammation, and fever. ° °Be sure to drink at least eight 8oz glasses of water to stay well hydrated and get at least 8 hours of sleep at night, preferably more while sick.  ° °Please take antibiotics as prescribed and be sure to complete entire course even if you start to feel better to ensure infection does not come back. ° °

## 2017-07-01 DIAGNOSIS — Z23 Encounter for immunization: Secondary | ICD-10-CM | POA: Diagnosis not present

## 2017-07-26 ENCOUNTER — Encounter: Payer: Self-pay | Admitting: Family Medicine

## 2017-07-26 DIAGNOSIS — H2513 Age-related nuclear cataract, bilateral: Secondary | ICD-10-CM | POA: Diagnosis not present

## 2017-07-26 DIAGNOSIS — H5213 Myopia, bilateral: Secondary | ICD-10-CM | POA: Diagnosis not present

## 2017-07-26 LAB — HM DIABETES EYE EXAM

## 2017-08-21 IMAGING — DX DG WRIST COMPLETE 3+V*R*
4 series · 4 of 4 positions shown · non-contrast
Comparison: None.

CLINICAL DATA: Right wrist and hand pain for 2 weeks.

EXAM:
RIGHT WRIST - COMPLETE 3+ VIEW

[wrist pa]
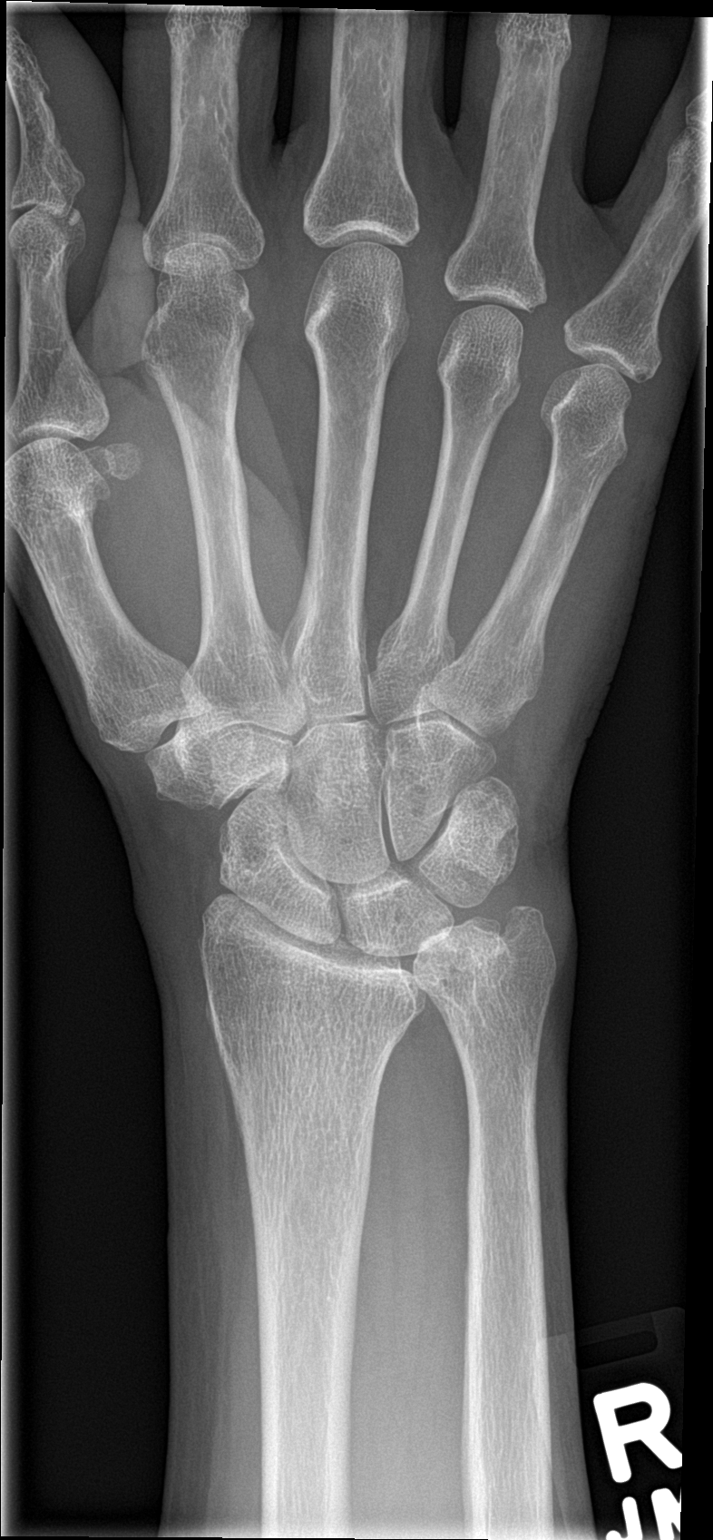

[wrist obl]
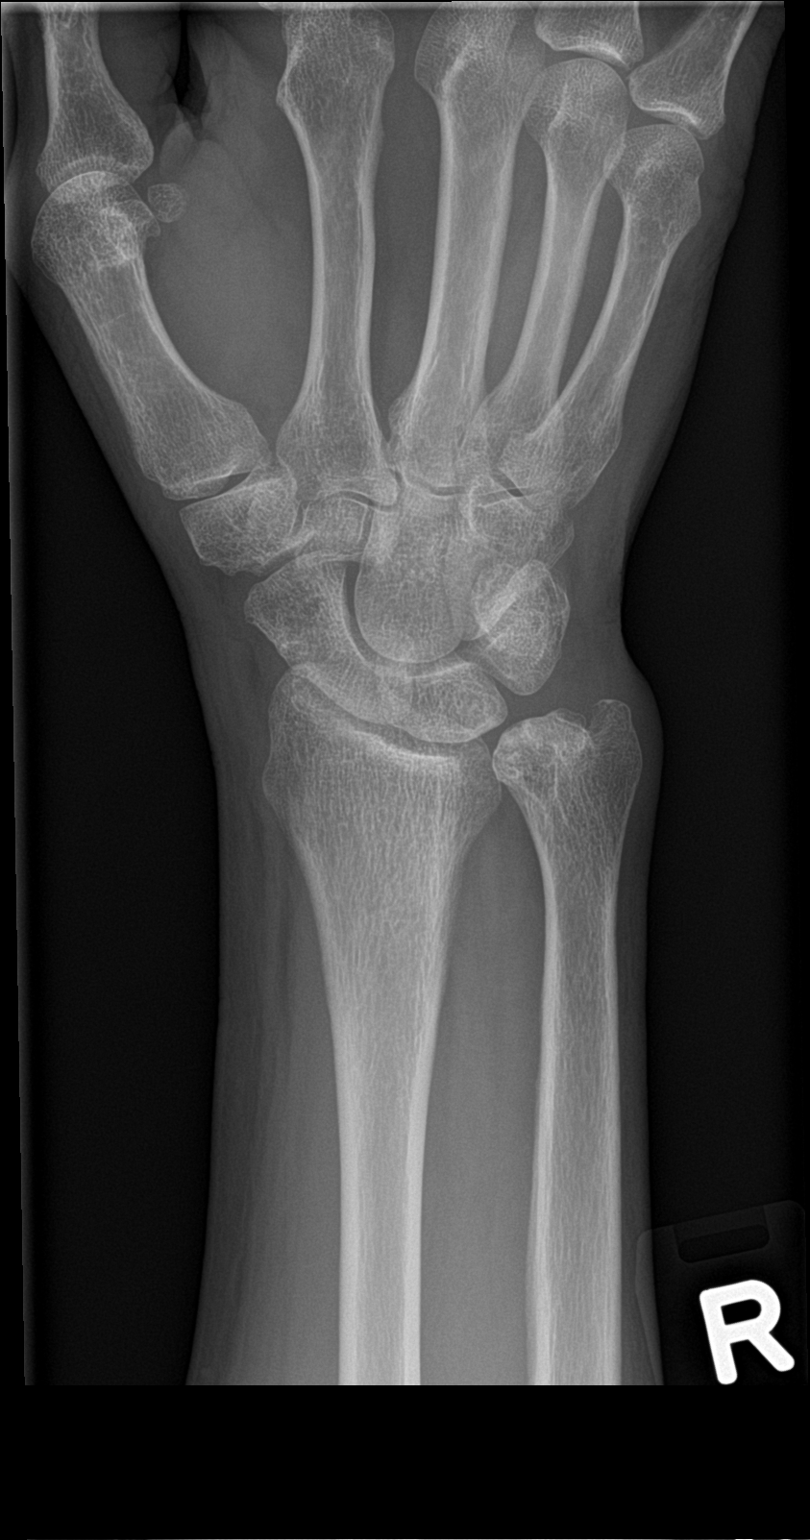

[wrist lat]
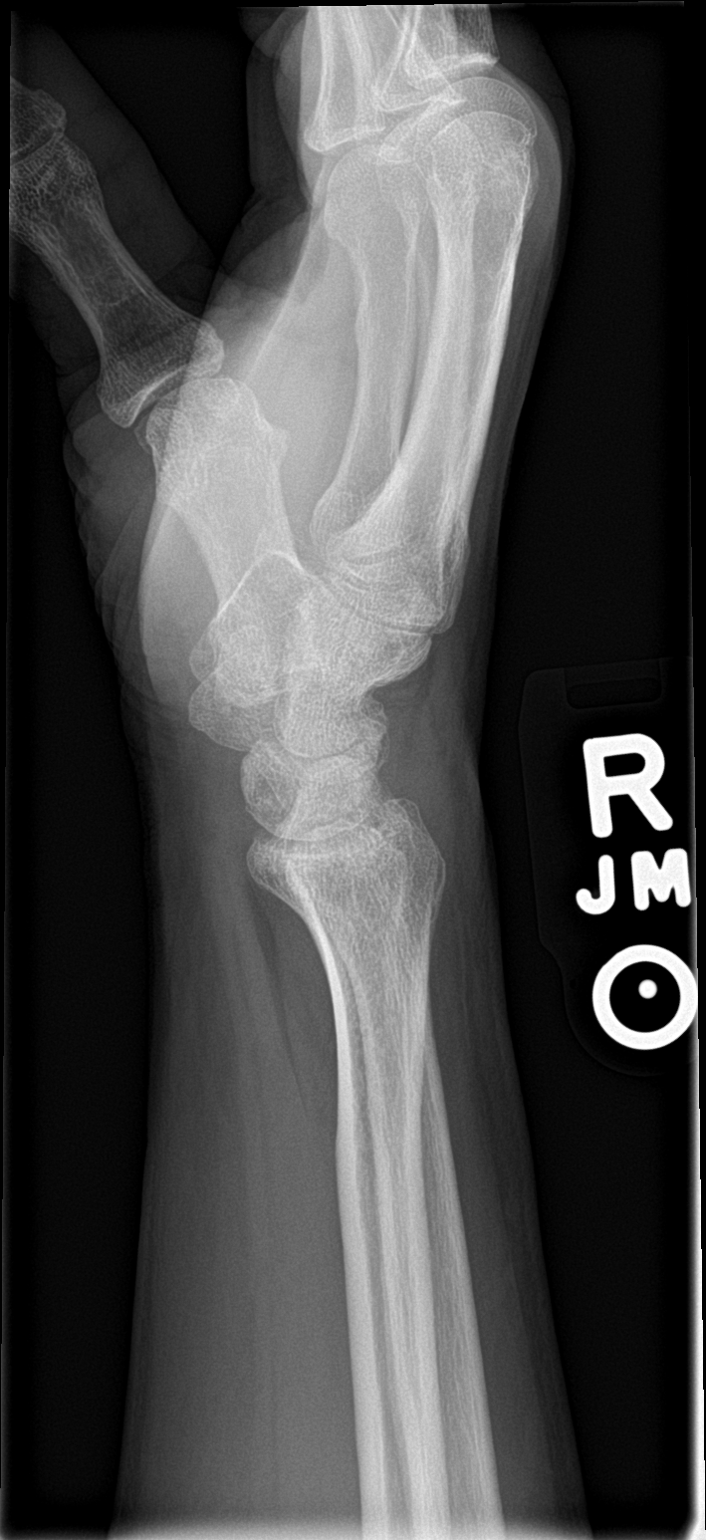

[wrist navicular]
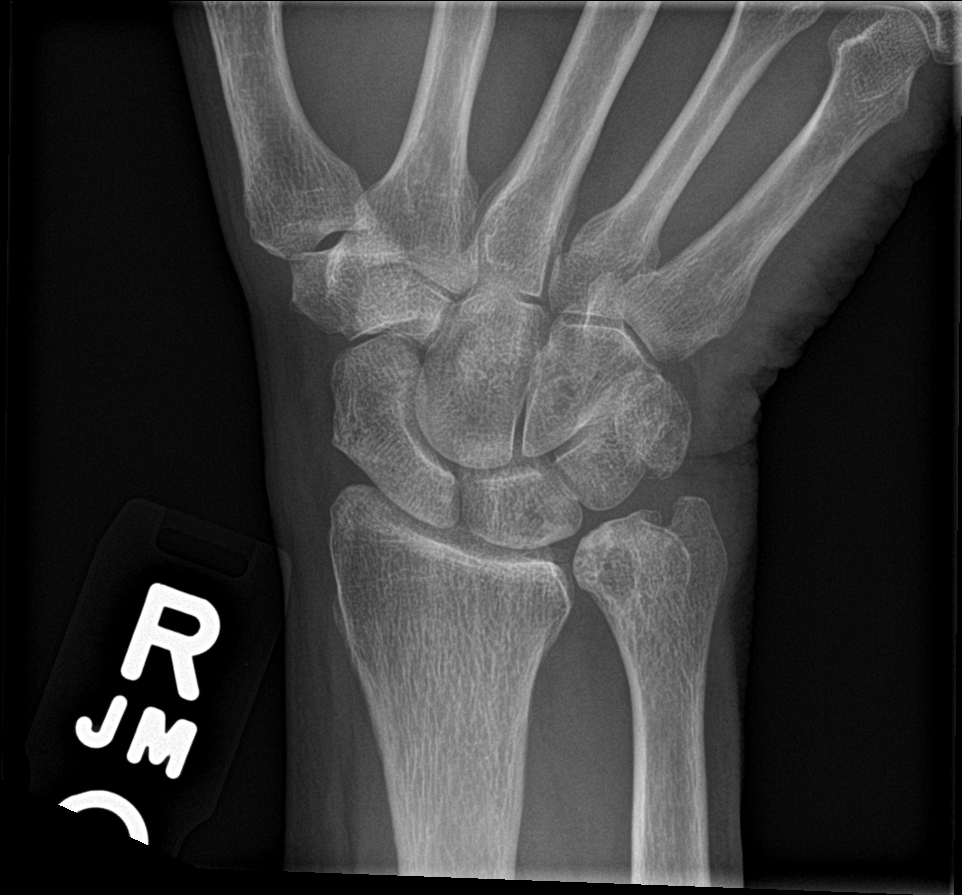

[4 of 4 positions shown; findings below may reference images not displayed]

FINDINGS: No fracture deformity or wrist degeneration. Possible erosive
findings described separately on hand study. Ulnar positive
variance. There is irregularity of the distal ulna but no abutment
changes in the lunate.
IMPRESSION: No acute finding.

Ulnar positive variance. The distal ulna is irregular but there are
no abutment changes in the lunate.

## 2017-10-19 ENCOUNTER — Ambulatory Visit (INDEPENDENT_AMBULATORY_CARE_PROVIDER_SITE_OTHER): Payer: Medicare Other | Admitting: Family Medicine

## 2017-10-19 ENCOUNTER — Encounter: Payer: Self-pay | Admitting: Family Medicine

## 2017-10-19 VITALS — BP 113/56 | HR 74 | Ht 67.0 in | Wt 142.0 lb

## 2017-10-19 DIAGNOSIS — F17200 Nicotine dependence, unspecified, uncomplicated: Secondary | ICD-10-CM

## 2017-10-19 DIAGNOSIS — Z1231 Encounter for screening mammogram for malignant neoplasm of breast: Secondary | ICD-10-CM

## 2017-10-19 DIAGNOSIS — D1803 Hemangioma of intra-abdominal structures: Secondary | ICD-10-CM

## 2017-10-19 DIAGNOSIS — E785 Hyperlipidemia, unspecified: Secondary | ICD-10-CM | POA: Diagnosis not present

## 2017-10-19 DIAGNOSIS — Z0000 Encounter for general adult medical examination without abnormal findings: Secondary | ICD-10-CM | POA: Diagnosis not present

## 2017-10-19 NOTE — Progress Notes (Signed)
Subjective:   Kristen Gay is a 67 y.o. female who presents for Medicare Annual (Subsequent) preventive examination.  Review of Systems:  Hence of review of systems is negative.       Objective:     Vitals: BP (!) 113/56   Pulse 74   Ht 5' 7"  (1.702 m)   Wt 142 lb (64.4 kg)   SpO2 98%   BMI 22.24 kg/m   Body mass index is 22.24 kg/m.  Advanced Directives 11/06/2015 11/06/2014  Does Patient Have a Medical Advance Directive? No No  Would patient like information on creating a medical advance directive? No - patient declined information Yes Higher education careers adviser given    Tobacco Social History   Tobacco Use  Smoking Status Current Every Day Smoker  . Packs/day: 0.25  . Years: 30.00  . Pack years: 7.50  . Types: Cigarettes  Smokeless Tobacco Never Used  Tobacco Comment   previously smoked 1/2 ppd     Ready to quit: Not Answered Counseling given: Not Answered Comment: previously smoked 1/2 ppd   Clinical Intake:       Physical Exam  Constitutional: She appears well-developed and well-nourished.  HENT:  Head: Normocephalic.  Right Ear: External ear normal.  Left Ear: External ear normal.  Nose: Nose normal.  Mouth/Throat: Oropharynx is clear and moist.  Eyes: Conjunctivae and EOM are normal. Right eye exhibits no discharge. Left eye exhibits no discharge.  Neck: Neck supple. No thyromegaly present.  Cardiovascular: Normal rate and normal heart sounds.  Radial pulse 2+ bilat  Respiratory: Effort normal and breath sounds normal.  Musculoskeletal: She exhibits no edema.  Lymphadenopathy:    She has no cervical adenopathy.  Skin: Skin is warm and dry.  Psychiatric: She has a normal mood and affect. Her behavior is normal.                   Past Medical History:  Diagnosis Date  . Hypercholesteremia    Past Surgical History:  Procedure Laterality Date  . APPENDECTOMY    . carpel tunnel release Bilateral    Family History  Problem  Relation Age of Onset  . Osler-Weber-Rendu syndrome Sister   . Hearing loss Sister   . Peripheral vascular disease Sister   . Heart Problems Sister        has a pacemaker   Social History   Socioeconomic History  . Marital status: Married    Spouse name: Archie  . Number of children: Not on file  . Years of education: Not on file  . Highest education level: Not on file  Occupational History  . Not on file  Social Needs  . Financial resource strain: Not on file  . Food insecurity:    Worry: Not on file    Inability: Not on file  . Transportation needs:    Medical: Not on file    Non-medical: Not on file  Tobacco Use  . Smoking status: Current Every Day Smoker    Packs/day: 0.25    Years: 30.00    Pack years: 7.50    Types: Cigarettes  . Smokeless tobacco: Never Used  . Tobacco comment: previously smoked 1/2 ppd  Substance and Sexual Activity  . Alcohol use: Yes    Alcohol/week: 0.0 oz  . Drug use: Not on file  . Sexual activity: Yes    Partners: Male  Lifestyle  . Physical activity:    Days per week: Not on file  Minutes per session: Not on file  . Stress: Not on file  Relationships  . Social connections:    Talks on phone: Not on file    Gets together: Not on file    Attends religious service: Not on file    Active member of club or organization: Not on file    Attends meetings of clubs or organizations: Not on file    Relationship status: Not on file  Other Topics Concern  . Not on file  Social History Narrative  . Not on file    Outpatient Encounter Medications as of 10/19/2017  Medication Sig  . atorvastatin (LIPITOR) 20 MG tablet Take 1 tablet (20 mg total) by mouth daily. (Patient not taking: Reported on 10/19/2017)  . [DISCONTINUED] albuterol (PROVENTIL HFA;VENTOLIN HFA) 108 (90 Base) MCG/ACT inhaler Inhale 1-2 puffs into the lungs every 6 (six) hours as needed for wheezing or shortness of breath.  . [DISCONTINUED] azithromycin (ZITHROMAX) 250 MG  tablet Take 1 tablet (250 mg total) by mouth daily. Take first 2 tablets together, then 1 every day until finished.  . [DISCONTINUED] benzonatate (TESSALON) 100 MG capsule Take 1-2 capsules (100-200 mg total) by mouth every 8 (eight) hours.  . [DISCONTINUED] chlorhexidine gluconate, SAGE KIT, (PERIDEX) 0.12 % solution Use as directed 10 mLs in the mouth or throat 2 (two) times daily.   No facility-administered encounter medications on file as of 10/19/2017.     Activities of Daily Living In your present state of health, do you have any difficulty performing the following activities: 10/19/2017  Hearing? N  Vision? N  Difficulty concentrating or making decisions? N  Walking or climbing stairs? N  Dressing or bathing? N  Doing errands, shopping? N  Some recent data might be hidden    Patient Care Team: Hali Marry, MD as PCP - General (Family Medicine) Christin Fudge., MD as Referring Physician (Neurology)    Assessment:   This is a routine wellness examination for [name].  Exercise Activities and Dietary recommendations    Goals    None      Fall Risk Fall Risk  10/19/2017 02/08/2017 11/06/2015 11/06/2014 08/23/2013  Falls in the past year? No No No No No  Comment - Emmi Telephone Survey: data to providers prior to load - - -    Depression Screen PHQ 2/9 Scores 10/19/2017 11/06/2015 11/06/2014 08/23/2013  PHQ - 2 Score 0 0 0 0     Cognitive Function     6CIT Screen 10/19/2017  What Year? 0 points  What month? 0 points  What time? 0 points  Count back from 20 0 points  Months in reverse 2 points  Repeat phrase 0 points  Total Score 2    Immunization History  Administered Date(s) Administered  . Influenza Whole 04/30/2006  . Influenza, High Dose Seasonal PF 07/01/2017  . Influenza,inj,Quad PF,6+ Mos 05/14/2014, 04/19/2017  . Pneumococcal Conjugate-13 11/06/2014  . Pneumococcal Polysaccharide-23 04/30/2006  . Tdap 12/19/2014  . Zoster 12/19/2014    Qualifies  for Shingles Vaccine? Yes  Screening Tests Health Maintenance  Topic Date Due  . DEXA SCAN  09/21/2013  . PNA vac Low Risk Adult (2 of 2 - PPSV23) 11/06/2015  . INFLUENZA VACCINE  02/17/2018  . MAMMOGRAM  03/11/2018  . COLONOSCOPY  05/18/2019  . TETANUS/TDAP  12/18/2024  . Hepatitis C Screening  Completed    Cancer Screenings: Lung: Low Dose CT Chest recommended if Age 48-80 years, 30 pack-year currently smoking OR have  quit w/in 15years. Patient does qualify. Breast:  Up to date on Mammogram? Maybe. She is not sure exactly when started smoking but does think has been 30 years.  Most she has ever smoked is 1/2 ppd.    Up to date of Bone Density/Dexa? No Colorectal: UTD  Additional Screenings: : Hepatitis C Screening:      Plan:   Medicare WEllness Exam   I have personally reviewed and noted the following in the patient's chart:   . Medical and social history updated.  . Use of alcohol, tobacco or illicit drugs  . Current medications and supplements . Functional ability and status . Nutritional status . Physical activity - not currently but encouraged her to start. She has a home gym.   . Advanced directives . List of other physicians . Hospitalizations, surgeries, and ER visits in previous 12 months . Vitals . Screenings to include cognitive, depression, and falls . Referrals and appointments . Tobacco abuse-currently smokes about 5 cigarettes/day sometimes less. . Shingles vaccine discussed.  Handout provided.  In addition, I have reviewed and discussed with patient certain preventive protocols, quality metrics, and best practice recommendations. A written personalized care plan for preventive services as well as general preventive health recommendations were provided to patient.     Beatrice Lecher, MD  10/19/2017

## 2017-10-19 NOTE — Patient Instructions (Signed)
We will place order for your mammogram.  They should be calling you to schedule.  If you do not hear from anybody by Friday then please let us know. Please consider getting the new shingles vaccine. Work on getting regular exercise.  Start with 2 days a week.

## 2017-10-21 DIAGNOSIS — F17200 Nicotine dependence, unspecified, uncomplicated: Secondary | ICD-10-CM | POA: Diagnosis not present

## 2017-10-21 DIAGNOSIS — D1803 Hemangioma of intra-abdominal structures: Secondary | ICD-10-CM | POA: Diagnosis not present

## 2017-10-21 DIAGNOSIS — E785 Hyperlipidemia, unspecified: Secondary | ICD-10-CM | POA: Diagnosis not present

## 2017-10-21 LAB — COMPLETE METABOLIC PANEL WITH GFR
AG Ratio: 1 (calc) (ref 1.0–2.5)
ALKALINE PHOSPHATASE (APISO): 77 U/L (ref 33–130)
ALT: 18 U/L (ref 6–29)
AST: 24 U/L (ref 10–35)
Albumin: 4.2 g/dL (ref 3.6–5.1)
BUN: 20 mg/dL (ref 7–25)
CHLORIDE: 106 mmol/L (ref 98–110)
CO2: 28 mmol/L (ref 20–32)
CREATININE: 0.77 mg/dL (ref 0.50–0.99)
Calcium: 9.9 mg/dL (ref 8.6–10.4)
GFR, Est African American: 91 mL/min/{1.73_m2} (ref >=60)
GFR, Est Non African American: 79 mL/min/{1.73_m2} (ref >=60)
GLOBULIN: 4.2 g/dL — AB (ref 1.9–3.7)
Glucose, Bld: 107 mg/dL — ABNORMAL HIGH (ref 65–99)
Potassium: 4.5 mmol/L (ref 3.5–5.3)
SODIUM: 139 mmol/L (ref 135–146)
Total Bilirubin: 0.5 mg/dL (ref 0.2–1.2)
Total Protein: 8.4 g/dL — ABNORMAL HIGH (ref 6.1–8.1)

## 2017-10-21 LAB — CBC
HEMATOCRIT: 41.4 % (ref 35.0–45.0)
Hemoglobin: 14.1 g/dL (ref 11.7–15.5)
MCH: 30.9 pg (ref 27.0–33.0)
MCHC: 34.1 g/dL (ref 32.0–36.0)
MCV: 90.6 fL (ref 80.0–100.0)
MPV: 11.7 fL (ref 7.5–12.5)
PLATELETS: 305 10*3/uL (ref 140–400)
RBC: 4.57 10*6/uL (ref 3.80–5.10)
RDW: 12.5 % (ref 11.0–15.0)
WBC: 6.5 10*3/uL (ref 3.8–10.8)

## 2017-10-21 LAB — LIPID PANEL
CHOL/HDL RATIO: 7.2 (calc) — AB (ref <5.0)
CHOLESTEROL: 245 mg/dL — AB (ref <200)
HDL: 34 mg/dL — ABNORMAL LOW (ref >50)
LDL Cholesterol (Calc): 176 mg/dL (calc) — ABNORMAL HIGH
Non-HDL Cholesterol (Calc): 211 mg/dL (calc) — ABNORMAL HIGH (ref <130)
Triglycerides: 191 mg/dL — ABNORMAL HIGH (ref <150)

## 2017-10-22 ENCOUNTER — Other Ambulatory Visit: Payer: Self-pay | Admitting: *Deleted

## 2017-10-22 DIAGNOSIS — R899 Unspecified abnormal finding in specimens from other organs, systems and tissues: Secondary | ICD-10-CM

## 2017-10-22 MED ORDER — ATORVASTATIN CALCIUM 20 MG PO TABS: 20.0000 mg | ORAL_TABLET | Freq: Every day | ORAL | 3 refills | Status: DC

## 2017-10-28 LAB — PROTEIN ELECTROPHORESIS, SERUM
ALBUMIN ELP: 4.4 g/dL (ref 3.8–4.8)
Alpha 1: 0.3 g/dL (ref 0.2–0.3)
Alpha 2: 0.7 g/dL (ref 0.5–0.9)
BETA 2: 0.6 g/dL — AB (ref 0.2–0.5)
Beta Globulin: 0.5 g/dL (ref 0.4–0.6)
GAMMA GLOBULIN: 1.7 g/dL (ref 0.8–1.7)
Total Protein: 8.3 g/dL — ABNORMAL HIGH (ref 6.1–8.1)

## 2017-10-28 LAB — PROTEIN ELECTROPHORESIS,RANDOM URN
ALBUMIN UR 24 HR ELECTRO: 34 %
ALPHA-2-GLOBULIN, U: 13 %
Alpha-1-Globulin, U: 4 %
BETA GLOBULIN, U: 24 %
Creatinine, Urine: 142 mg/dL (ref 20–275)
Gamma Globulin, U: 25 %
PROTEIN/CREAT RATIO: 56 mg/g{creat} (ref 21–161)
TOTAL PROTEIN, URINE: 8 mg/dL (ref 5–24)

## 2017-10-28 LAB — IFE INTERPRETATION: Immunofix Electr Int: NOT DETECTED

## 2017-11-18 DIAGNOSIS — Z1231 Encounter for screening mammogram for malignant neoplasm of breast: Secondary | ICD-10-CM | POA: Diagnosis not present

## 2017-11-23 ENCOUNTER — Other Ambulatory Visit: Payer: Self-pay

## 2017-11-23 DIAGNOSIS — Z1231 Encounter for screening mammogram for malignant neoplasm of breast: Secondary | ICD-10-CM

## 2017-12-30 ENCOUNTER — Other Ambulatory Visit: Payer: Self-pay

## 2017-12-30 MED ORDER — ATORVASTATIN CALCIUM 20 MG PO TABS: 20.0000 mg | ORAL_TABLET | Freq: Every day | ORAL | 2 refills | Status: DC

## 2017-12-30 NOTE — Telephone Encounter (Signed)
Originally RX sent to [facility], pt now requesting it be sent to mail order. Will send CN

## 2018-05-03 ENCOUNTER — Telehealth: Payer: Self-pay

## 2018-05-03 NOTE — Telephone Encounter (Signed)
She received a refill from [facility] of Atorvastatin. She states the pills are different. She has been having dizziness with the new medication. She stopped taking the atorvastatin and the dizziness resolved.

## 2018-05-04 NOTE — Telephone Encounter (Signed)
Patient advised of recommendations. She will call back when she decides.

## 2018-05-04 NOTE — Telephone Encounter (Signed)
Atorvastatin is generic so I suspect they changed to a different generic which is why they looked different.  I would actually encourage [facility] to [facility] just verify that they did send the correct prescription and the correct bottle.  And then she could ask them if they would be willing to send the previous brand that was available.  If not then her other choice would be for [place] to send it locally to a pharmacy here in town.  Her third option would be for [place] to just switch to a completely different statin and send it to [facility] and just try something else.

## 2018-05-05 NOTE — Telephone Encounter (Signed)
Pt called back - wants new statin sent to [facility]

## 2018-05-06 MED ORDER — ROSUVASTATIN CALCIUM 20 MG PO TABS: 20.0000 mg | ORAL_TABLET | Freq: Every day | ORAL | 3 refills | Status: DC

## 2018-05-06 NOTE — Telephone Encounter (Signed)
New rx sent for crestor generic.

## 2018-05-06 NOTE — Telephone Encounter (Signed)
Attempted to call pt, number unavailable and unable to conenct

## 2018-06-02 ENCOUNTER — Ambulatory Visit (INDEPENDENT_AMBULATORY_CARE_PROVIDER_SITE_OTHER): Payer: Medicare Other | Admitting: Family Medicine

## 2018-06-02 DIAGNOSIS — Z23 Encounter for immunization: Secondary | ICD-10-CM | POA: Diagnosis not present

## 2018-10-20 ENCOUNTER — Encounter: Payer: Medicare Other | Admitting: Family Medicine

## 2018-10-25 NOTE — Progress Notes (Signed)
Subjective:   Kristen Gay is a 68 y.o. female who presents for Medicare Annual (Subsequent) preventive examination.  Review of Systems:  No ROS.  Medicare Wellness Visit. Additional risk factors are reflected in the social history.  Cardiac Risk Factors include: dyslipidemia;advanced age (>49men, >56 women) Sleep patterns:Getting 6-7 hours of sleep a night.Wakes up 1 time a night to go to the bathroom. Wakes up feeling rested.,  Home Safety/Smoke Alarms: Feels safe in home. Smoke alarms in place.  Living environment; Lives with husband in a 1 story home. Hand rails are on steps leading into the back deck. Shower is a step over tub and grab bars are in place. Seat Belt Safety/Bike Helmet: Wears seat belt.   Female:   Pap- aged out      [place]-  utd     Dexa scan-  Offered and patient declined      [facility]-  utd     Objective:     Vitals: There were no vitals taken for this visit.  There is no height or weight on file to calculate BMI.  Advanced Directives 10/26/2018 11/06/2015 11/06/2014  Does Patient Have a Medical Advance Directive? No No No  Would patient like information on creating a medical advance directive? No - Patient declined No - patient declined information Yes - Scientist, clinical (histocompatibility and immunogenetics) given    Tobacco Social History   Tobacco Use  Smoking Status Current Every Day Smoker  . Packs/day: 0.25  . Years: 30.00  . Pack years: 7.50  . Types: Cigarettes  Smokeless Tobacco Never Used  Tobacco Comment   previously smoked 1/2 ppd     Ready to quit: No Counseling given: No Comment: previously smoked 1/2 ppd   Clinical Intake:  Pre-visit preparation completed: Yes  Pain : No/denies pain     Nutritional Risks: None Diabetes: No  How often do you need to have someone help you when you read instructions, pamphlets, or other written materials from your doctor or pharmacy?: 1 - Never What is the last grade level you completed in school?: 12  Interpreter Needed?:  No  Information entered by :: Orlie Dakin, LPN  Past Medical History:  Diagnosis Date  . Hypercholesteremia    Past Surgical History:  Procedure Laterality Date  . APPENDECTOMY    . carpel tunnel release Bilateral    Family History  Problem Relation Age of Onset  . Osler-Weber-Rendu syndrome Sister   . Hearing loss Sister   . Peripheral vascular disease Sister   . Heart Problems Sister        has a pacemaker   Social History   Socioeconomic History  . Marital status: Married    Spouse name: Archie  . Number of children: 1  . Years of education: 49  . Highest education level: 12th grade  Occupational History  . 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 Financial resource strain: Not hard at all  . Food insecurity:    Worry: Never true    Inability: Never true  . Transportation needs:    Medical: No    Non-medical: No  Tobacco Use  . Smoking status: Current Every Day Smoker    Packs/day: 0.25    Years: 30.00    Pack years: 7.50    Types: Cigarettes  . Smokeless tobacco: Never Used  . Tobacco comment: previously smoked 1/2 ppd  Substance and Sexual Activity  . Alcohol use: Not Currently    Alcohol/week: 0.0  standard drinks  . Drug use: Never  . Sexual activity: Not Currently    Partners: Male  Lifestyle  . Physical activity:    Days per week: 0 days    Minutes per session: 0 min  . Stress: Not at all  Relationships  . Social connections:    Talks on phone: More than three times a week    Gets together: Twice a week    Attends religious service: Never    Active member of club or organization: No    Attends meetings of clubs or organizations: Never    Relationship status: Married  Other Topics Concern  . Not on file  Social History Narrative   Drinks 1 cup of coffee a day   Sister just passed away on 2022-10-26 and having a lot to do with that    Outpatient Encounter Medications as of 10/26/2018  Medication Sig  . rosuvastatin  (CRESTOR) 20 MG tablet Take 1 tablet (20 mg total) by mouth daily.  . [DISCONTINUED] atorvastatin (LIPITOR) 20 MG tablet Take 1 tablet (20 mg total) by mouth daily. (Patient not taking: Reported on 10/26/2018)   No facility-administered encounter medications on file as of 10/26/2018.     Activities of Daily Living In your present state of health, do you have any difficulty performing the following activities: 10/26/2018  Hearing? N  Vision? N  Difficulty concentrating or making decisions? N  Walking or climbing stairs? N  Dressing or bathing? N  Doing errands, shopping? N  Preparing Food and eating ? N  Using the Toilet? N  In the past six months, have you accidently leaked urine? N  Do you have problems with loss of bowel control? N  Managing your Medications? N  Managing your Finances? N  Housekeeping or managing your Housekeeping? N  Some recent data might be hidden    Patient Care Team: Hali Marry, MD as PCP - General (Family Medicine) Christin Fudge., MD as Referring Physician (Neurology)    Assessment:   This is a routine wellness examination for Kristen Gay.Physical assessment deferred to PCP.   Exercise Activities and Dietary recommendations Current Exercise Habits: The patient does not participate in regular exercise at present, Exercise limited by: None identified Diet Eats a healthy diet of meats, vegetables and proteins. Breakfast: fried egg and bacon or drinks Slimfast drink for the protein. Lunch: sandwich or something, salad  Dinner: Meats and vegetables     Goals    . Exercise 3x per week (30 min per time)     Increase exercise- she has equipment in the house just needs more motivation to do it.       Fall Risk Fall Risk  10/26/2018 10/19/2017 02/08/2017 11/06/2015 11/06/2014  Falls in the past year? 0 No No No No  Comment - - Emmi Telephone Survey: data to providers prior to load - -  Follow up Falls prevention discussed - - - -   Is the patient's home  free of loose throw rugs in walkways, pet beds, electrical cords, etc?   yes      Grab bars in the bathroom? yes      Handrails on the stairs?   yes      Adequate lighting?   yes   Depression Screen PHQ 2/9 Scores 10/26/2018 10/19/2017 11/06/2015 11/06/2014  PHQ - 2 Score 1 0 0 0     Cognitive Function     6CIT Screen 10/26/2018 10/19/2017  What Year? 0 points 0  points  What month? 0 points 0 points  What time? 0 points 0 points  Count back from 20 0 points 0 points  Months in reverse 0 points 2 points  Repeat phrase 0 points 0 points  Total Score 0 2    Immunization History  Administered Date(s) Administered  . Influenza Whole 04/30/2006  . Influenza, High Dose Seasonal PF 07/01/2017, 06/02/2018  . Influenza,inj,Quad PF,6+ Mos 05/14/2014, 04/19/2017  . Pneumococcal Conjugate-13 11/06/2014  . Pneumococcal Polysaccharide-23 04/30/2006  . Tdap 12/19/2014  . Zoster 12/19/2014    Screening Tests Health Maintenance  Topic Date Due  . DEXA SCAN  09/21/2013  . PNA vac Low Risk Adult (2 of 2 - PPSV23) 11/06/2015  . INFLUENZA VACCINE  02/18/2019  . COLONOSCOPY  05/18/2019  . MAMMOGRAM  11/24/2019  . TETANUS/TDAP  12/18/2024  . Hepatitis C Screening  Completed        Plan:  Unable to obtain Vital signs due visit over the telephone and patient does not have a BP cuff to take her blood pressure  [name] , Thank you for taking time to come for your Medicare Wellness Visit. I appreciate your ongoing commitment to your health goals. Please review the following plan we discussed and let me know if I can assist you in the future.   Please schedule your next medicare wellness visit with me in 1 yr. Bring a copy of your living will and/or healthcare power of attorney to your next office visit.   These are the goals we discussed: Goals    . Exercise 3x per week (30 min per time)     Increase exercise- she has equipment in the house just needs more motivation to do it.       This  is a list of the screening recommended for you and due dates:  Health Maintenance  Topic Date Due  . DEXA scan (bone density measurement)  09/21/2013  . Pneumonia vaccines (2 of 2 - PPSV23) 11/06/2015  . Flu Shot  02/18/2019  . Colon Cancer Screening  05/18/2019  . Mammogram  11/24/2019  . Tetanus Vaccine  12/18/2024  .  Hepatitis C: One time screening is recommended by Center for Disease Control  (CDC) for  adults born from [birth year] through 1967.   Completed     I have personally reviewed and noted the following in the patient's chart:   . Medical and social history . Use of alcohol, tobacco or illicit drugs  . Current medications and supplements . Functional ability and status . Nutritional status . Physical activity . Advanced directives . List of other physicians . Hospitalizations, surgeries, and ER visits in previous 12 months . Vitals . Screenings to include cognitive, depression, and falls . Referrals and appointments  In addition, I have reviewed and discussed with patient certain preventive protocols, quality metrics, and best practice recommendations. A written personalized care plan for preventive services as well as general preventive health recommendations were provided to patient.     Joanne Chars, LPN  09/21/3612

## 2018-10-26 ENCOUNTER — Other Ambulatory Visit: Payer: Self-pay

## 2018-10-26 ENCOUNTER — Ambulatory Visit (INDEPENDENT_AMBULATORY_CARE_PROVIDER_SITE_OTHER): Payer: Medicare Other | Admitting: *Deleted

## 2018-10-26 VITALS — Wt 142.0 lb

## 2018-10-26 DIAGNOSIS — Z0000 Encounter for general adult medical examination without abnormal findings: Secondary | ICD-10-CM | POA: Diagnosis not present

## 2018-10-26 NOTE — Patient Instructions (Addendum)
Unable to obtain Vital signs due visit over the telephone and patient does not have a BP cuff to take her blood pressure   Ms. Kristen Gay , Thank you for taking time to come for your Medicare Wellness Visit. I appreciate your ongoing commitment to your health goals. Please review the following plan we discussed and let me know if I can assist you in the future.   Please schedule your next medicare wellness visit with me in 1 yr. Bring a copy of your living will and/or healthcare power of attorney to your next office visit.  These are the goals we discussed: Goals    . Exercise 3x per week (30 min per time)     Increase exercise- she has equipment in the house just needs more motivation to do it.

## 2018-12-19 ENCOUNTER — Ambulatory Visit: Payer: Medicare Other

## 2019-01-30 DIAGNOSIS — Z1231 Encounter for screening mammogram for malignant neoplasm of breast: Secondary | ICD-10-CM | POA: Diagnosis not present

## 2019-01-30 LAB — HM MAMMOGRAPHY

## 2019-02-10 ENCOUNTER — Encounter: Payer: Self-pay | Admitting: Family Medicine

## 2019-04-03 DIAGNOSIS — Z23 Encounter for immunization: Secondary | ICD-10-CM | POA: Diagnosis not present

## 2019-04-12 ENCOUNTER — Other Ambulatory Visit: Payer: Self-pay | Admitting: Family Medicine

## 2019-04-12 ENCOUNTER — Telehealth: Payer: Self-pay

## 2019-04-12 DIAGNOSIS — J449 Chronic obstructive pulmonary disease, unspecified: Secondary | ICD-10-CM

## 2019-04-12 DIAGNOSIS — E785 Hyperlipidemia, unspecified: Secondary | ICD-10-CM

## 2019-04-12 DIAGNOSIS — M949 Disorder of cartilage, unspecified: Secondary | ICD-10-CM

## 2019-04-12 DIAGNOSIS — D1803 Hemangioma of intra-abdominal structures: Secondary | ICD-10-CM

## 2019-04-12 DIAGNOSIS — M899 Disorder of bone, unspecified: Secondary | ICD-10-CM

## 2019-04-12 MED ORDER — ROSUVASTATIN CALCIUM 20 MG PO TABS: 20.0000 mg | ORAL_TABLET | Freq: Every day | ORAL | 3 refills | Status: DC

## 2019-04-12 NOTE — Telephone Encounter (Signed)
Concha needs fasting labs.

## 2019-04-12 NOTE — Telephone Encounter (Signed)
Labs printed.

## 2019-04-12 NOTE — Telephone Encounter (Signed)
Pt advised.

## 2019-04-20 ENCOUNTER — Other Ambulatory Visit: Payer: Self-pay

## 2019-04-20 ENCOUNTER — Ambulatory Visit (INDEPENDENT_AMBULATORY_CARE_PROVIDER_SITE_OTHER): Payer: Medicare Other | Admitting: Family Medicine

## 2019-04-20 VITALS — BP 129/70 | HR 79 | Temp 98.2°F | Wt 142.0 lb

## 2019-04-20 DIAGNOSIS — E785 Hyperlipidemia, unspecified: Secondary | ICD-10-CM

## 2019-04-20 NOTE — Progress Notes (Signed)
Agree with documentation as above.   Venesa Semidey, MD  

## 2019-04-20 NOTE — Progress Notes (Signed)
Patient came in for lab draw. Patient tolerated blood draw well on Right antecubital area without complications. Two tiger/speckled and one lavender tubes were drawn, spun and placed in UC for pickup. Patient was requesting med refill for rosuvastatin. She will be running out of medication in 1 week. Please send rx to [facility].

## 2019-04-21 LAB — CBC WITH DIFFERENTIAL/PLATELET
Absolute Monocytes: 714 cells/uL (ref 200–950)
Basophils Absolute: 89 cells/uL (ref 0–200)
Basophils Relative: 1.5 %
Eosinophils Absolute: 118 cells/uL (ref 15–500)
Eosinophils Relative: 2 %
HCT: 43 % (ref 35.0–45.0)
Hemoglobin: 14.4 g/dL (ref 11.7–15.5)
Lymphs Abs: 2325 cells/uL (ref 850–3900)
MCH: 31 pg (ref 27.0–33.0)
MCHC: 33.5 g/dL (ref 32.0–36.0)
MCV: 92.5 fL (ref 80.0–100.0)
MPV: 12.3 fL (ref 7.5–12.5)
Monocytes Relative: 12.1 %
Neutro Abs: 2655 cells/uL (ref 1500–7800)
Neutrophils Relative %: 45 %
Platelets: 267 10*3/uL (ref 140–400)
RBC: 4.65 10*6/uL (ref 3.80–5.10)
RDW: 12.4 % (ref 11.0–15.0)
Total Lymphocyte: 39.4 %
WBC: 5.9 10*3/uL (ref 3.8–10.8)

## 2019-04-21 LAB — COMPLETE METABOLIC PANEL WITH GFR
AG Ratio: 1.1 (calc) (ref 1.0–2.5)
ALT: 15 U/L (ref 6–29)
AST: 23 U/L (ref 10–35)
Albumin: 4.4 g/dL (ref 3.6–5.1)
Alkaline phosphatase (APISO): 78 U/L (ref 37–153)
BUN: 11 mg/dL (ref 7–25)
CO2: 28 mmol/L (ref 20–32)
Calcium: 10.1 mg/dL (ref 8.6–10.4)
Chloride: 105 mmol/L (ref 98–110)
Creat: 0.7 mg/dL (ref 0.60–0.93)
GFR, Est African American: 102 mL/min/{1.73_m2} (ref >=60)
GFR, Est Non African American: 88 mL/min/{1.73_m2} (ref >=60)
Globulin: 4 g/dL (calc) — ABNORMAL HIGH (ref 1.9–3.7)
Glucose, Bld: 109 mg/dL — ABNORMAL HIGH (ref 65–99)
Potassium: 4.7 mmol/L (ref 3.5–5.3)
Sodium: 141 mmol/L (ref 135–146)
Total Bilirubin: 0.5 mg/dL (ref 0.2–1.2)
Total Protein: 8.4 g/dL — ABNORMAL HIGH (ref 6.1–8.1)

## 2019-04-21 LAB — LIPID PANEL W/REFLEX DIRECT LDL
Cholesterol: 141 mg/dL (ref <200)
HDL: 32 mg/dL — ABNORMAL LOW (ref >=50)
LDL Cholesterol (Calc): 84 mg/dL (calc)
Non-HDL Cholesterol (Calc): 109 mg/dL (calc) (ref <130)
Total CHOL/HDL Ratio: 4.4 (calc) (ref <5.0)
Triglycerides: 145 mg/dL (ref <150)

## 2019-08-26 DIAGNOSIS — Z23 Encounter for immunization: Secondary | ICD-10-CM | POA: Diagnosis not present

## 2019-09-23 DIAGNOSIS — Z23 Encounter for immunization: Secondary | ICD-10-CM | POA: Diagnosis not present

## 2020-02-27 DIAGNOSIS — Z1231 Encounter for screening mammogram for malignant neoplasm of breast: Secondary | ICD-10-CM | POA: Diagnosis not present

## 2020-02-27 LAB — HM MAMMOGRAPHY

## 2020-03-05 ENCOUNTER — Telehealth: Payer: Self-pay

## 2020-03-05 NOTE — Telephone Encounter (Signed)
Imaging for has been faxed. Patient has appointment tomorrow.   Faxed order.

## 2020-03-06 DIAGNOSIS — R590 Localized enlarged lymph nodes: Secondary | ICD-10-CM | POA: Diagnosis not present

## 2020-03-18 DIAGNOSIS — R599 Enlarged lymph nodes, unspecified: Secondary | ICD-10-CM | POA: Diagnosis not present

## 2020-03-18 DIAGNOSIS — R590 Localized enlarged lymph nodes: Secondary | ICD-10-CM | POA: Diagnosis not present

## 2020-05-08 DIAGNOSIS — Z23 Encounter for immunization: Secondary | ICD-10-CM | POA: Diagnosis not present

## 2020-05-15 DIAGNOSIS — Z23 Encounter for immunization: Secondary | ICD-10-CM | POA: Diagnosis not present

## 2020-07-30 ENCOUNTER — Other Ambulatory Visit: Payer: Self-pay

## 2020-07-30 DIAGNOSIS — E785 Hyperlipidemia, unspecified: Secondary | ICD-10-CM

## 2020-07-30 MED ORDER — ROSUVASTATIN CALCIUM 20 MG PO TABS: 20.0000 mg | ORAL_TABLET | Freq: Every day | ORAL | 0 refills | Status: DC

## 2020-07-30 NOTE — Telephone Encounter (Signed)
Panzy is requesting a refill on Crestor. Sent in a 30 day supply. Ordered labs and scheduled patient for a follow up appointment.

## 2020-08-14 ENCOUNTER — Other Ambulatory Visit: Payer: Self-pay

## 2020-08-14 MED ORDER — ROSUVASTATIN CALCIUM 20 MG PO TABS: 20.0000 mg | ORAL_TABLET | Freq: Every day | ORAL | 0 refills | Status: DC

## 2020-08-20 ENCOUNTER — Ambulatory Visit: Payer: Medicare Other | Admitting: Family Medicine

## 2020-08-27 ENCOUNTER — Encounter: Payer: Self-pay | Admitting: Family Medicine

## 2020-08-27 ENCOUNTER — Other Ambulatory Visit: Payer: Self-pay

## 2020-08-27 ENCOUNTER — Ambulatory Visit (INDEPENDENT_AMBULATORY_CARE_PROVIDER_SITE_OTHER): Payer: Medicare Other | Admitting: Family Medicine

## 2020-08-27 VITALS — BP 137/86 | HR 90 | Ht 67.0 in | Wt 155.6 lb

## 2020-08-27 DIAGNOSIS — F4321 Adjustment disorder with depressed mood: Secondary | ICD-10-CM

## 2020-08-27 DIAGNOSIS — M949 Disorder of cartilage, unspecified: Secondary | ICD-10-CM

## 2020-08-27 DIAGNOSIS — N281 Cyst of kidney, acquired: Secondary | ICD-10-CM

## 2020-08-27 DIAGNOSIS — J449 Chronic obstructive pulmonary disease, unspecified: Secondary | ICD-10-CM | POA: Diagnosis not present

## 2020-08-27 DIAGNOSIS — R7309 Other abnormal glucose: Secondary | ICD-10-CM

## 2020-08-27 DIAGNOSIS — M899 Disorder of bone, unspecified: Secondary | ICD-10-CM | POA: Diagnosis not present

## 2020-08-27 DIAGNOSIS — M8580 Other specified disorders of bone density and structure, unspecified site: Secondary | ICD-10-CM | POA: Diagnosis not present

## 2020-08-27 DIAGNOSIS — R945 Abnormal results of liver function studies: Secondary | ICD-10-CM | POA: Diagnosis not present

## 2020-08-27 DIAGNOSIS — E785 Hyperlipidemia, unspecified: Secondary | ICD-10-CM

## 2020-08-27 LAB — CBC: Hemoglobin: 14.3 g/dL (ref 11.7–15.5)

## 2020-08-27 MED ORDER — ROSUVASTATIN CALCIUM 20 MG PO TABS: 20.0000 mg | ORAL_TABLET | Freq: Every day | ORAL | 3 refills | Status: DC

## 2020-08-27 NOTE — Progress Notes (Signed)
Established Patient Office Visit  Subjective:  Patient ID: Kristen Gay, female    DOB: 09/20/1950  Age: 70 y.o. MRN: [id number]  CC:  Chief Complaint  Patient presents with  . Hyperlipidemia    HPI [name] presents for routine follow-up.  She has not been here in a year and a half.  Unfortunately she lost her sister who was only a year older than her at the beginning of Covid in April 2020.  She said she spent most of that year just feeling angry and upset.  She actually still feels like she is really grieving for her even now but feels like things are better than they were.  Though she reports that she feels very unmotivated to get out of the house and do things she just feels more in different.  She says her husband has really been encouraging her to come in and be seen.  She herself has been vaccinated has been taking precautions against Covid.  Hyperlipidemia - tolerating stating well with no myalgias or significant side effects.  Lab Results  Component Value Date   CHOL 141 04/20/2019   HDL 32 (L) 04/20/2019   LDLCALC 84 04/20/2019   LDLDIRECT 146 (H) 11/07/2007   TRIG 145 04/20/2019   CHOLHDL 4.4 04/20/2019    Follow-up osteopenia-   She is not currently on calcium with vitamin D but plans to restart.      Past Medical History:  Diagnosis Date  . Hypercholesteremia     Past Surgical History:  Procedure Laterality Date  . APPENDECTOMY    . carpel tunnel release Bilateral     Family History  Problem Relation Age of Onset  . Osler-Weber-Rendu syndrome Sister   . Hearing loss Sister   . Peripheral vascular disease Sister   . Heart Problems Sister        has a pacemaker    Social History   Socioeconomic History  . Marital status: Married    Spouse name: Archie  . Number of children: 1  . Years of education: 42  . Highest education level: 12th grade  Occupational History  . Occupation: statitical asst.    Comment: retired  Tobacco Use  . Smoking  status: Current Every Day Smoker    Packs/day: 0.25    Years: 30.00    Pack years: 7.50    Types: Cigarettes  . Smokeless tobacco: Never Used  . Tobacco comment: previously smoked 1/2 ppd  Vaping Use  . Vaping Use: Never used  Substance and Sexual Activity  . Alcohol use: Not Currently    Alcohol/week: 0.0 standard drinks  . Drug use: Never  . Sexual activity: Not Currently    Partners: Male  Other Topics Concern  . Not on file  Social History Narrative   Drinks 1 cup of coffee a day   Sister just passed away on 2022-11-01 and having a lot to do with that   Social Determinants of Health   Financial Resource Strain: Not on file  Food Insecurity: Not on file  Transportation Needs: Not on file  Physical Activity: Not on file  Stress: Not on file  Social Connections: Not on file  Intimate Partner Violence: Not on file    Outpatient Medications Prior to Visit  Medication Sig Dispense Refill  . rosuvastatin (CRESTOR) 20 MG tablet Take 1 tablet (20 mg total) by mouth daily. 90 tablet 0   No facility-administered medications prior to visit.    Allergies  Allergen Reactions  .  Codeine Other (See Comments)    Headache  . Cortisone     ROS Review of Systems    Objective:    Physical Exam Constitutional:      Appearance: She is well-developed and well-nourished.  HENT:     Head: Normocephalic and atraumatic.  Cardiovascular:     Rate and Rhythm: Normal rate and regular rhythm.     Heart sounds: Normal heart sounds.  Pulmonary:     Effort: Pulmonary effort is normal.     Breath sounds: Normal breath sounds.  Skin:    General: Skin is warm and dry.  Neurological:     Mental Status: She is alert and oriented to person, place, and time.  Psychiatric:        Mood and Affect: Mood and affect normal.        Behavior: Behavior normal.     BP 137/86   Pulse 90   Ht 5\' 7"  (1.702 m)   Wt 155 lb 9.6 oz (70.6 kg)   SpO2 94%   BMI 24.37 kg/m  Wt Readings from Last 3  Encounters:  08/27/20 155 lb 9.6 oz (70.6 kg)  04/20/19 142 lb (64.4 kg)  10/26/18 142 lb (64.4 kg)     Health Maintenance Due  Topic Date Due  . DEXA SCAN  Never done  . PNA vac Low Risk Adult (2 of 2 - PPSV23) 11/06/2015  . COLONOSCOPY (Pts 45-23yrs Insurance coverage will need to be confirmed)  05/18/2019    There are no preventive care reminders to display for this patient.  Lab Results  Component Value Date   TSH 1.82 12/21/2012   Lab Results  Component Value Date   WBC 5.9 04/20/2019   HGB 14.4 04/20/2019   HCT 43.0 04/20/2019   MCV 92.5 04/20/2019   PLT 267 04/20/2019   Lab Results  Component Value Date   NA 141 04/20/2019   K 4.7 04/20/2019   CO2 28 04/20/2019   GLUCOSE 109 (H) 04/20/2019   BUN 11 04/20/2019   CREATININE 0.70 04/20/2019   BILITOT 0.5 04/20/2019   ALKPHOS 82 11/06/2015   AST 23 04/20/2019   ALT 15 04/20/2019   PROT 8.4 (H) 04/20/2019   ALBUMIN 4.1 11/06/2015   CALCIUM 10.1 04/20/2019   Lab Results  Component Value Date   CHOL 141 04/20/2019   Lab Results  Component Value Date   HDL 32 (L) 04/20/2019   Lab Results  Component Value Date   LDLCALC 84 04/20/2019   Lab Results  Component Value Date   TRIG 145 04/20/2019   Lab Results  Component Value Date   CHOLHDL 4.4 04/20/2019   No results found for: HGBA1C    Assessment & Plan:   Problem List Items Addressed This Visit      Respiratory   COPD, mild ([place])    Not currently on medication.  On lab to get her scheduled for spirometry again at some point I think her last test was back in 2016 so it has been 5 years.      Relevant Medications   rosuvastatin (CRESTOR) 20 MG tablet   Other Relevant Orders   CBC   COMPLETE METABOLIC PANEL WITH GFR   Lipid panel   Hemoglobin A1c   VITAMIN D 25 Hydroxy (Vit-D Deficiency, Fractures)     Musculoskeletal and Integument   Osteopenia    Encouraged her to restart her calcium with vitamin D.  It looks like she is coming up due  for a bone density soon.  Also discussed the importance of weightbearing exercise.  She used to walk 6 miles a day but since the passing of her sister has not been as active.      Relevant Medications   rosuvastatin (CRESTOR) 20 MG tablet   Other Relevant Orders   CBC   COMPLETE METABOLIC PANEL WITH GFR   Lipid panel   Hemoglobin A1c   VITAMIN D 25 Hydroxy (Vit-D Deficiency, Fractures)   Disorder of bone and cartilage   Relevant Orders   Hemoglobin A1c     Genitourinary   Bilateral renal cysts   Relevant Medications   rosuvastatin (CRESTOR) 20 MG tablet   Other Relevant Orders   CBC   COMPLETE METABOLIC PANEL WITH GFR   Lipid panel   Hemoglobin A1c   VITAMIN D 25 Hydroxy (Vit-D Deficiency, Fractures)     Other   LIVER FUNCTION TESTS, ABNORMAL    Her function has been abnormal in the past I definitely want keep a close eye on this and plan to recheck today.      Hyperlipidemia LDL goal <130 - Primary    Due to recheck lipids and liver enzymes.  Were going to refill medication for a year.      Relevant Medications   rosuvastatin (CRESTOR) 20 MG tablet   Other Relevant Orders   CBC   COMPLETE METABOLIC PANEL WITH GFR   Lipid panel   Hemoglobin A1c   VITAMIN D 25 Hydroxy (Vit-D Deficiency, Fractures)    Other Visit Diagnoses    Abnormal glucose       Grief          Abnormal glucose-we will do an A1c on labs today.  She did have a little bit of sugar in her coffee this morning but no food.  Grief-discussed options including grief counseling and or even medication at this point since it has been almost 2 years since her sister passed.  We also discussed getting back into some type of regular exercise routine which I think would be really helpful for her.  She will reach out if she decides to do either of these options.  Meds ordered this encounter  Medications  . rosuvastatin (CRESTOR) 20 MG tablet    Sig: Take 1 tablet (20 mg total) by mouth daily.    Dispense:   90 tablet    Refill:  3    Follow-up: Return in about 6 months (around 02/24/2021) for blood pressure.    Beatrice Lecher, MD

## 2020-08-27 NOTE — Assessment & Plan Note (Addendum)
Encouraged her to restart her calcium with vitamin D.  It looks like she is coming up due for a bone density soon.  Also discussed the importance of weightbearing exercise.  She used to walk 6 miles a day but since the passing of her sister has not been as active.

## 2020-08-27 NOTE — Assessment & Plan Note (Signed)
Her function has been abnormal in the past I definitely want keep a close eye on this and plan to recheck today.

## 2020-08-27 NOTE — Assessment & Plan Note (Signed)
Due to recheck lipids and liver enzymes.  Were going to refill medication for a year.

## 2020-08-27 NOTE — Assessment & Plan Note (Addendum)
Not currently on medication.  On lab to get her scheduled for spirometry again at some point I think her last test was back in 2016 so it has been 5 years.

## 2020-08-28 LAB — COMPLETE METABOLIC PANEL WITH GFR
AG Ratio: 1.2 (calc) (ref 1.0–2.5)
ALT: 16 U/L (ref 6–29)
AST: 21 U/L (ref 10–35)
Albumin: 4.4 g/dL (ref 3.6–5.1)
Alkaline phosphatase (APISO): 80 U/L (ref 37–153)
BUN: 10 mg/dL (ref 7–25)
CO2: 30 mmol/L (ref 20–32)
Calcium: 10 mg/dL (ref 8.6–10.4)
Chloride: 106 mmol/L (ref 98–110)
Creat: 0.67 mg/dL (ref 0.60–0.93)
GFR, Est African American: 102 mL/min/{1.73_m2} (ref >=60)
GFR, Est Non African American: 88 mL/min/{1.73_m2} (ref >=60)
Globulin: 3.7 g/dL (calc) (ref 1.9–3.7)
Glucose, Bld: 126 mg/dL — ABNORMAL HIGH (ref 65–99)
Potassium: 4.6 mmol/L (ref 3.5–5.3)
Sodium: 142 mmol/L (ref 135–146)
Total Bilirubin: 0.6 mg/dL (ref 0.2–1.2)
Total Protein: 8.1 g/dL (ref 6.1–8.1)

## 2020-08-28 LAB — LIPID PANEL
Cholesterol: 239 mg/dL — ABNORMAL HIGH (ref <200)
HDL: 32 mg/dL — ABNORMAL LOW (ref >=50)
LDL Cholesterol (Calc): 159 mg/dL (calc) — ABNORMAL HIGH
Non-HDL Cholesterol (Calc): 207 mg/dL (calc) — ABNORMAL HIGH (ref <130)
Total CHOL/HDL Ratio: 7.5 (calc) — ABNORMAL HIGH (ref <5.0)
Triglycerides: 314 mg/dL — ABNORMAL HIGH (ref <150)

## 2020-08-28 LAB — CBC
HCT: 41 % (ref 35.0–45.0)
MCH: 32.2 pg (ref 27.0–33.0)
MCHC: 34.9 g/dL (ref 32.0–36.0)
MCV: 92.3 fL (ref 80.0–100.0)
MPV: 11.5 fL (ref 7.5–12.5)
Platelets: 259 10*3/uL (ref 140–400)
RBC: 4.44 10*6/uL (ref 3.80–5.10)
RDW: 12.7 % (ref 11.0–15.0)
WBC: 5.7 10*3/uL (ref 3.8–10.8)

## 2020-08-28 LAB — VITAMIN D 25 HYDROXY (VIT D DEFICIENCY, FRACTURES): Vit D, 25-Hydroxy: 32 ng/mL (ref 30–100)

## 2020-08-28 LAB — HEMOGLOBIN A1C
Hgb A1c MFr Bld: 6.2 % of total Hgb — ABNORMAL HIGH (ref <5.7)
Mean Plasma Glucose: 131 mg/dL
eAG (mmol/L): 7.3 mmol/L

## 2020-10-11 ENCOUNTER — Other Ambulatory Visit: Payer: Self-pay | Admitting: *Deleted

## 2020-10-11 ENCOUNTER — Telehealth: Payer: Self-pay | Admitting: *Deleted

## 2020-10-11 NOTE — Progress Notes (Signed)
error 

## 2020-10-11 NOTE — Telephone Encounter (Signed)
Order for US Breast Bilateral axilla faxed. Confirmation received.

## 2020-11-01 ENCOUNTER — Telehealth: Payer: Medicare Other

## 2020-11-08 ENCOUNTER — Ambulatory Visit (INDEPENDENT_AMBULATORY_CARE_PROVIDER_SITE_OTHER): Payer: Medicare Other | Admitting: Family Medicine

## 2020-11-08 ENCOUNTER — Ambulatory Visit: Payer: Medicare Other

## 2020-11-08 DIAGNOSIS — Z780 Asymptomatic menopausal state: Secondary | ICD-10-CM | POA: Diagnosis not present

## 2020-11-08 DIAGNOSIS — Z0000 Encounter for general adult medical examination without abnormal findings: Secondary | ICD-10-CM | POA: Diagnosis not present

## 2020-11-08 NOTE — Patient Instructions (Addendum)
[place] Maintenance Summary and Written Plan of Care  Ms. Kristen Gay ,  Thank you for allowing me to perform your Medicare Annual Wellness Visit and for your ongoing commitment to your health.   Health Maintenance & Immunization History Health Maintenance  Topic Date Due  . DEXA SCAN  11/08/2021 (Originally 09/21/2013)  . COLONOSCOPY (Pts 45-11yrs Insurance coverage will need to be confirmed)  11/08/2021 (Originally 05/18/2019)  . PNA vac Low Risk Adult (2 of 2 - PPSV23) 11/08/2021 (Originally 11/06/2015)  . INFLUENZA VACCINE  02/17/2021  . MAMMOGRAM  02/26/2022  . TETANUS/TDAP  12/18/2024  . COVID-19 Vaccine  Completed  . Hepatitis C Screening  Completed  . HPV VACCINES  Aged Out   Immunization History  Administered Date(s) Administered  . Influenza Whole 04/30/2006  . Influenza, High Dose Seasonal PF 07/01/2017, 06/02/2018  . Influenza,inj,Quad PF,6+ Mos 05/14/2014, 04/19/2017  . Moderna SARS-COV2 Booster Vaccination 11/06/2020  . Moderna Sars-Covid-2 Vaccination 08/26/2019, 09/23/2019, 05/15/2020  . Pneumococcal Conjugate-13 11/06/2014  . Pneumococcal Polysaccharide-23 04/30/2006  . Tdap 12/19/2014  . Zoster 12/19/2014    These are the patient goals that we discussed: Goals Addressed              This Visit's Progress   .  Patient Stated (pt-stated)        11/08/2020 AWV Goal: Exercise for General Health   Patient will verbalize understanding of the benefits of increased physical activity:  Exercising regularly is important. It will improve your overall fitness, flexibility, and endurance.  Regular exercise also will improve your overall health. It can help you control your weight, reduce stress, and improve your bone density.  Over the next year, patient will increase physical activity as tolerated with a goal of at least 150 minutes of moderate physical activity per week.   You can tell that you are exercising at a moderate intensity  if your heart starts beating faster and you start breathing faster but can still hold a conversation.  Moderate-intensity exercise ideas include:  Walking 1 mile (1.6 km) in about 15 minutes  Biking  Hiking  Golfing  Dancing  Water aerobics  Patient will verbalize understanding of everyday activities that increase physical activity by providing examples like the following: ? Yard work, such as: ? Pushing a Conservation officer, nature ? Raking and bagging leaves ? Washing your car ? Pushing a stroller ? Shoveling snow ? Gardening ? Washing windows or floors  Patient will be able to explain general safety guidelines for exercising:   Before you start a new exercise program, talk with your health care provider.  Do not exercise so much that you hurt yourself, feel dizzy, or get very short of breath.  Wear comfortable clothes and wear shoes with good support.  Drink plenty of water while you exercise to prevent dehydration or heat stroke.  Work out until your breathing and your heartbeat get faster.         This is a list of Health Maintenance Items that are overdue or due now: There are no preventive care reminders to display for this patient.   Orders/Referrals Placed Today: Orders Placed This Encounter  Procedures  . DEXAScan    Standing Status:   Future    Standing Expiration Date:   11/08/2021    Scheduling Instructions:     Please call patient schedule.    Order Specific Question:   Reason for exam:    Answer:   Post Menopsaul  Order Specific Question:   Preferred imaging location?    Answer:   [facility]   (Contact our referral department at [phone] if you have not spoken with someone about your referral appointment within the next 5 days)    Follow-up Plan        Bone Density Test A bone density test uses a type of X-ray to measure the amount of calcium and other minerals in a person's bones. It can measure bone density in the hip and the  spine. The test is similar to having a regular X-ray. This test may also be called:  Bone densitometry.  Bone mineral density test.  Dual-energy X-ray absorptiometry (DEXA). You may have this test to:  Diagnose a condition that causes weak or thin bones (osteoporosis).  Screen you for osteoporosis.  Predict your risk for a broken bone (fracture).  Determine how well your osteoporosis treatment is working. Tell a health care provider about:  Any allergies you have.  All medicines you are taking, including vitamins, herbs, eye drops, creams, and over-the-counter medicines.  Any problems you or family members have had with anesthetic medicines.  Any blood disorders you have.  Any surgeries you have had.  Any medical conditions you have.  Whether you are pregnant or may be pregnant.  Any medical tests you have had within the past 14 days that used contrast material. What are the risks? Generally, this is a safe test. However, it does expose you to a small amount of radiation, which can slightly increase your cancer risk. What happens before the test?  Do not take any calcium supplements within the 24 hours before your test.  You will need to remove all metal jewelry, eyeglasses, removable dental appliances, and any other metal objects on your body. What happens during the test?  You will lie down on an exam table. There will be an X-ray generator below you and an imaging device above you.  Other devices, such as boxes or braces, may be used to position your body properly for the scan.  The machine will slowly scan your body. You will need to keep very still while the machine does the scan.  The images will show up on a screen in the room. Images will be examined by a specialist after your test is finished. The procedure may vary among health care providers and hospitals.   What can I expect after the test? It is up to you to get the results of your test. Ask your health  care provider, or the department that is doing the test, when your results will be ready. Summary  A bone density test is an imaging test that uses a type of X-ray to measure the amount of calcium and other minerals in your bones.  The test may be used to diagnose or screen you for a condition that causes weak or thin bones (osteoporosis), predict your risk for a broken bone (fracture), or determine how well your osteoporosis treatment is working.  Do not take any calcium supplements within 24 hours before your test.  Ask your health care provider, or the department that is doing the test, when your results will be ready. This information is not intended to replace advice given to you by your health care provider. Make sure you discuss any questions you have with your health care provider. Document Revised: 12/21/2019 Document Reviewed: 12/21/2019 Elsevier Patient Education  [place] Maintenance, Female Adopting a healthy lifestyle and  getting preventive care are important in promoting health and wellness. Ask your health care provider about:  The right schedule for you to have regular tests and exams.  Things you can do on your own to prevent diseases and keep yourself healthy. What should I know about diet, weight, and exercise? Eat a healthy diet  Eat a diet that includes plenty of vegetables, fruits, low-fat dairy products, and lean protein.  Do not eat a lot of foods that are high in solid fats, added sugars, or sodium.   Maintain a healthy weight Body mass index (BMI) is used to identify weight problems. It estimates body fat based on height and weight. Your health care provider can help determine your BMI and help you achieve or maintain a healthy weight. Get regular exercise Get regular exercise. This is one of the most important things you can do for your health. Most adults should:  Exercise for at least 150 minutes each week. The exercise should increase  your heart rate and make you sweat (moderate-intensity exercise).  Do strengthening exercises at least twice a week. This is in addition to the moderate-intensity exercise.  Spend less time sitting. Even light physical activity can be beneficial. Watch cholesterol and blood lipids Have your blood tested for lipids and cholesterol at 72 years of age, then have this test every 5 years. Have your cholesterol levels checked more often if:  Your lipid or cholesterol levels are high.  You are older than 72 years of age.  You are at high risk for heart disease. What should I know about cancer screening? Depending on your health history and family history, you may need to have cancer screening at various ages. This may include screening for:  Breast cancer.  Cervical cancer.  Colorectal cancer.  Skin cancer.  Lung cancer. What should I know about heart disease, diabetes, and high blood pressure? Blood pressure and heart disease  High blood pressure causes heart disease and increases the risk of stroke. This is more likely to develop in people who have high blood pressure readings, are of African descent, or are overweight.  Have your blood pressure checked: ? Every 3-5 years if you are 90-41 years of age. ? Every year if you are 33 years old or older. Diabetes Have regular diabetes screenings. This checks your fasting blood sugar level. Have the screening done:  Once every three years after age 58 if you are at a normal weight and have a low risk for diabetes.  More often and at a younger age if you are overweight or have a high risk for diabetes. What should I know about preventing infection? Hepatitis B If you have a higher risk for hepatitis B, you should be screened for this virus. Talk with your health care provider to find out if you are at risk for hepatitis B infection. Hepatitis C Testing is recommended for:  Everyone born from [birth year] through 1967.  Anyone with known  risk factors for hepatitis C. Sexually transmitted infections (STIs)  Get screened for STIs, including gonorrhea and chlamydia, if: ? You are sexually active and are younger than 72 years of age. ? You are older than 72 years of age and your health care provider tells you that you are at risk for this type of infection. ? Your sexual activity has changed since you were last screened, and you are at increased risk for chlamydia or gonorrhea. Ask your health care provider if you are at risk.  Ask  your health care provider about whether you are at high risk for HIV. Your health care provider may recommend a prescription medicine to help prevent HIV infection. If you choose to take medicine to prevent HIV, you should first get tested for HIV. You should then be tested every 3 months for as long as you are taking the medicine. Pregnancy  If you are about to stop having your period (premenopausal) and you may become pregnant, seek counseling before you get pregnant.  Take 400 to 800 micrograms (mcg) of folic acid every day if you become pregnant.  Ask for birth control (contraception) if you want to prevent pregnancy. Osteoporosis and menopause Osteoporosis is a disease in which the bones lose minerals and strength with aging. This can result in bone fractures. If you are 89 years old or older, or if you are at risk for osteoporosis and fractures, ask your health care provider if you should:  Be screened for bone loss.  Take a calcium or vitamin D supplement to lower your risk of fractures.  Be given hormone replacement therapy (HRT) to treat symptoms of menopause. Follow these instructions at home: Lifestyle  Do not use any products that contain nicotine or tobacco, such as cigarettes, e-cigarettes, and chewing tobacco. If you need help quitting, ask your health care provider.  Do not use street drugs.  Do not share needles.  Ask your health care provider for help if you need support or  information about quitting drugs. Alcohol use  Do not drink alcohol if: ? Your health care provider tells you not to drink. ? You are pregnant, may be pregnant, or are planning to become pregnant.  If you drink alcohol: ? Limit how much you use to 0-1 drink a day. ? Limit intake if you are breastfeeding.  Be aware of how much alcohol is in your drink. In the [place] one drink equals one 12 oz bottle of beer (355 mL), one 5 oz glass of wine (148 mL), or one 1 oz glass of hard liquor (44 mL). General instructions  Schedule regular health, dental, and eye exams.  Stay current with your vaccines.  Tell your health care provider if: ? You often feel depressed. ? You have ever been abused or do not feel safe at home. Summary  Adopting a healthy lifestyle and getting preventive care are important in promoting health and wellness.  Follow your health care provider's instructions about healthy diet, exercising, and getting tested or screened for diseases.  Follow your health care provider's instructions on monitoring your cholesterol and blood pressure. This information is not intended to replace advice given to you by your health care provider. Make sure you discuss any questions you have with your health care provider. Document Revised: 06/29/2018 Document Reviewed: 06/29/2018 Elsevier Patient Education  2021 [employer].

## 2020-11-08 NOTE — Progress Notes (Signed)
MEDICARE ANNUAL WELLNESS VISIT  11/08/2020  Telephone Visit Disclaimer This Medicare AWV was conducted by telephone due to national recommendations for restrictions regarding the COVID-19 Pandemic (e.g. social distancing).  I verified, using two identifiers, that I am speaking with Kristen Gay or their authorized healthcare agent. I discussed the limitations, risks, security, and privacy concerns of performing an evaluation and management service by telephone and the potential availability of an in-person appointment in the future. The patient expressed understanding and agreed to proceed.  Location of Patient: Home Location of Provider (nurse):  In the office.  Subjective:    Kristen Gay is a 70 y.o. female patient of Metheney, Rene Kocher, MD who had a Medicare Annual Wellness Visit today via telephone. Kristen Gay is Retired and lives with their spouse. Kristen Gay has 1 child. Kristen Gay reports that Kristen Gay is socially active and does interact with friends/family regularly. Kristen Gay is minimally physically active and enjoys dancing and listening to old music.  Patient Care Team: Hali Marry, MD as PCP - General (Family Medicine) Christin Fudge., MD as Referring Physician (Neurology)  Advanced Directives 11/08/2020 10/26/2018 11/06/2015 11/06/2014  Does Patient Have a Medical Advance Directive? No No No No  Would patient like information on creating a medical advance directive? No - Patient declined No - Patient declined No - patient declined information Yes - Educational materials given    Hospital Utilization Over the Past 12 Months: # of hospitalizations or ER visits: 0 # of surgeries: 0  Review of Systems    Patient reports that her overall health is unchanged compared to last year.  History obtained from chart review and the patient  Patient Reported Readings (BP, Pulse, CBG, Weight, etc) none  Pain Assessment Pain : No/denies pain     Current Medications & Allergies  (verified) Allergies as of 11/08/2020      Reactions   Codeine Other (See Comments)   Headache   Cortisone       Medication List       Accurate as of November 08, 2020  3:37 PM. If you have any questions, ask your nurse or doctor.        rosuvastatin 20 MG tablet Commonly known as: CRESTOR Take 1 tablet (20 mg total) by mouth daily.       History (reviewed): Past Medical History:  Diagnosis Date  . Hypercholesteremia    Past Surgical History:  Procedure Laterality Date  . APPENDECTOMY    . carpel tunnel release Bilateral    Family History  Problem Relation Age of Onset  . Osler-Weber-Rendu syndrome Sister   . Hearing loss Sister   . Peripheral vascular disease Sister   . Heart Problems Sister        has a pacemaker   Social History   Socioeconomic History  . Marital status: Married    Spouse name: Kristen Gay  . Number of children: 1  . Years of education: 43  . Highest education level: 12th grade  Occupational History  . Occupation: statitical asst.    Comment: retired  Tobacco Use  . Smoking status: Current Every Day Smoker    Packs/day: 0.25    Years: 30.00    Pack years: 7.50    Types: Cigarettes  . Smokeless tobacco: Never Used  . Tobacco comment: previously smoked 1/2 ppd  Vaping Use  . Vaping Use: Never used  Substance and Sexual Activity  . Alcohol use: Yes    Alcohol/week: 0.0 standard drinks  Comment: occassionally  . Drug use: Never  . Sexual activity: Not Currently    Partners: Male  Other Topics Concern  . Not on file  Social History Narrative   Lives with her husband. Drinks 1 cup of coffee a day   Sister just passed away in 11/06/18 and still misses her. Her hobbies include doing yard work, Education administrator and organizing her house and reading.   Social Determinants of Health   Financial Resource Strain: Low Risk   . Difficulty of Paying Living Expenses: Not hard at all  Food Insecurity: No Food Insecurity  . Worried About Charity fundraiser  in the Last Year: Never true  . Ran Out of Food in the Last Year: Never true  Transportation Needs: No Transportation Needs  . Lack of Transportation (Medical): No  . Lack of Transportation (Non-Medical): No  Physical Activity: Inactive  . Days of Exercise per Week: 0 days  . Minutes of Exercise per Session: 0 min  Stress: No Stress Concern Present  . Feeling of Stress : Not at all  Social Connections: Moderately Isolated  . Frequency of Communication with Friends and Family: More than three times a week  . Frequency of Social Gatherings with Friends and Family: Three times a week  . Attends Religious Services: Never  . Active Member of Clubs or Organizations: No  . Attends Archivist Meetings: Never  . Marital Status: Married    Activities of Daily Living In your present state of health, do you have any difficulty performing the following activities: 11/08/2020  Hearing? N  Vision? N  Difficulty concentrating or making decisions? N  Walking or climbing stairs? N  Dressing or bathing? N  Doing errands, shopping? N  Preparing Food and eating ? N  Using the Toilet? N  In the past six months, have you accidently leaked urine? N  Do you have problems with loss of bowel control? N  Managing your Medications? N  Managing your Finances? N  Housekeeping or managing your Housekeeping? N  Some recent data might be hidden    Patient Education/ Literacy How often do you need to have someone help you when you read instructions, pamphlets, or other written materials from your doctor or pharmacy?: 1 - Never What is the last grade level you completed in school?: 12th grade and some college  Exercise Current Exercise Habits: The patient does not participate in regular exercise at present, Exercise limited by: None identified  Diet Patient reports consuming 2-3 meals a day and 0-1 snack(s) a day Patient reports that her primary diet is: Regular Patient reports that Kristen Gay does have  regular access to food.   Depression Screen PHQ 2/9 Scores 11/08/2020 10/26/2018 10/19/2017 11/06/2015 11/06/2014 08/23/2013  PHQ - 2 Score 0 1 0 0 0 0     Fall Risk Fall Risk  11/08/2020 10/26/2018 10/19/2017 02/08/2017 11/06/2015  Falls in the past year? 0 0 No No No  Comment - - - Emmi Telephone Survey: data to providers prior to load -  Number falls in past yr: 0 - - - -  Injury with Fall? 0 - - - -  Risk for fall due to : No Fall Risks - - - -  Follow up Falls evaluation completed Falls prevention discussed - - -     Objective:  Kristen Gay seemed alert and oriented and Kristen Gay participated appropriately during our telephone visit.  Blood Pressure Weight BMI  BP Readings from Last 3 Encounters:  08/27/20 137/86  04/20/19 129/70  10/19/17 (!) 113/56   Wt Readings from Last 3 Encounters:  08/27/20 155 lb 9.6 oz (70.6 kg)  04/20/19 142 lb (64.4 kg)  10/26/18 142 lb (64.4 kg)   BMI Readings from Last 1 Encounters:  08/27/20 24.37 kg/m    *Unable to obtain current vital signs, weight, and BMI due to telephone visit type  Hearing/Vision  . Kristen Gay did not seem to have difficulty with hearing/understanding during the telephone conversation . Reports that Kristen Gay has not had a formal eye exam by an eye care professional within the past year . Reports that Kristen Gay has not had a formal hearing evaluation within the past year *Unable to fully assess hearing and vision during telephone visit type  Cognitive Function: 6CIT Screen 11/08/2020 10/26/2018 10/19/2017  What Year? 0 points 0 points 0 points  What month? 0 points 0 points 0 points  What time? 0 points 0 points 0 points  Count back from 20 0 points 0 points 0 points  Months in reverse 0 points 0 points 2 points  Repeat phrase 0 points 0 points 0 points  Total Score 0 0 2   (Normal:0-7, Significant for Dysfunction: >8)  Normal Cognitive Function Screening: Yes   Immunization & Health Maintenance Record Immunization History  Administered  Date(s) Administered  . Influenza Whole 04/30/2006  . Influenza, High Dose Seasonal PF 07/01/2017, 06/02/2018  . Influenza,inj,Quad PF,6+ Mos 05/14/2014, 04/19/2017  . Moderna SARS-COV2 Booster Vaccination 11/06/2020  . Moderna Sars-Covid-2 Vaccination 08/26/2019, 09/23/2019, 05/15/2020  . Pneumococcal Conjugate-13 11/06/2014  . Pneumococcal Polysaccharide-23 04/30/2006  . Tdap 12/19/2014  . Zoster 12/19/2014    Health Maintenance  Topic Date Due  . DEXA SCAN  11/08/2021 (Originally 09/21/2013)  . COLONOSCOPY (Pts 45-13yrs Insurance coverage will need to be confirmed)  11/08/2021 (Originally 05/18/2019)  . PNA vac Low Risk Adult (2 of 2 - PPSV23) 11/08/2021 (Originally 11/06/2015)  . INFLUENZA VACCINE  02/17/2021  . MAMMOGRAM  02/26/2022  . TETANUS/TDAP  12/18/2024  . COVID-19 Vaccine  Completed  . Hepatitis C Screening  Completed  . HPV VACCINES  Aged Out       Assessment  This is a routine wellness examination for Kristen Gay.  Health Maintenance: Due or Overdue There are no preventive care reminders to display for this patient.  Kristen Gay does not need a referral for [employer] Assistance: Care Management:   no Social Work:    no Prescription Assistance:  no Nutrition/Diabetes Education:  no   Plan:  Personalized Goals Goals Addressed              This Visit's Progress   .  Patient Stated (pt-stated)        11/08/2020 AWV Goal: Exercise for General Health   Patient will verbalize understanding of the benefits of increased physical activity:  Exercising regularly is important. It will improve your overall fitness, flexibility, and endurance.  Regular exercise also will improve your overall health. It can help you control your weight, reduce stress, and improve your bone density.  Over the next year, patient will increase physical activity as tolerated with a goal of at least 150 minutes of moderate physical activity per week.   You can tell that you are  exercising at a moderate intensity if your heart starts beating faster and you start breathing faster but can still hold a conversation.  Moderate-intensity exercise ideas include:  Walking 1 mile (1.6 km) in about 15 minutes  Biking  Hiking  Golfing  Dancing  Water aerobics  Patient will verbalize understanding of everyday activities that increase physical activity by providing examples like the following: ? Yard work, such as: ? Pushing a Conservation officer, nature ? Raking and bagging leaves ? Washing your car ? Pushing a stroller ? Shoveling snow ? Gardening ? Washing windows or floors  Patient will be able to explain general safety guidelines for exercising:   Before you start a new exercise program, talk with your health care provider.  Do not exercise so much that you hurt yourself, feel dizzy, or get very short of breath.  Wear comfortable clothes and wear shoes with good support.  Drink plenty of water while you exercise to prevent dehydration or heat stroke.  Work out until your breathing and your heartbeat get faster.       Personalized Health Maintenance & Screening Recommendations  Pneumococcal vaccine  Bone densitometry screening Colorectal cancer screening - due in 2025 according to the report from Dr. Percell Miller.  Lung Cancer Screening Recommended: yes ; patient declined at this time. (Low Dose CT Chest recommended if Age 63-80 years, 30 pack-year currently smoking OR have quit w/in past 15 years) Hepatitis C Screening recommended: no HIV Screening recommended: no  Advanced Directives: Written information was not prepared per patient's request.  Referrals & Orders Orders Placed This Encounter  Procedures  . [place]    Follow-up Plan . Follow-up with Hali Marry, MD as planned . Schedule your shingrix vaccine at your pharmacy.  . Bone density referral has been sent and they will call you to schedule.  . Pneumonia vaccine can be done at your next  in-office visit. . Medicare wellness in one year.   I have personally reviewed and noted the following in the patient's chart:   . Medical and social history . Use of alcohol, tobacco or illicit drugs  . Current medications and supplements . Functional ability and status . Nutritional status . Physical activity . Advanced directives . List of other physicians . Hospitalizations, surgeries, and ER visits in previous 12 months . Vitals . Screenings to include cognitive, depression, and falls . Referrals and appointments  In addition, I have reviewed and discussed with Kristen Gay certain preventive protocols, quality metrics, and best practice recommendations. A written personalized care plan for preventive services as well as general preventive health recommendations is available and can be mailed to the patient at her request.      Kristen Gens, RN  11/08/2020

## 2020-12-24 ENCOUNTER — Encounter: Payer: Self-pay | Admitting: Family Medicine

## 2021-02-24 ENCOUNTER — Ambulatory Visit: Payer: Medicare Other | Admitting: Family Medicine

## 2021-03-03 ENCOUNTER — Other Ambulatory Visit: Payer: Self-pay

## 2021-03-03 ENCOUNTER — Encounter: Payer: Self-pay | Admitting: Family Medicine

## 2021-03-03 ENCOUNTER — Ambulatory Visit (INDEPENDENT_AMBULATORY_CARE_PROVIDER_SITE_OTHER): Payer: Medicare Other | Admitting: Family Medicine

## 2021-03-03 VITALS — BP 147/65 | HR 75 | Ht 67.0 in | Wt 143.0 lb

## 2021-03-03 DIAGNOSIS — E785 Hyperlipidemia, unspecified: Secondary | ICD-10-CM

## 2021-03-03 DIAGNOSIS — I10 Essential (primary) hypertension: Secondary | ICD-10-CM | POA: Insufficient documentation

## 2021-03-03 DIAGNOSIS — F17200 Nicotine dependence, unspecified, uncomplicated: Secondary | ICD-10-CM

## 2021-03-03 DIAGNOSIS — R7301 Impaired fasting glucose: Secondary | ICD-10-CM

## 2021-03-03 DIAGNOSIS — R7309 Other abnormal glucose: Secondary | ICD-10-CM

## 2021-03-03 LAB — POCT GLYCOSYLATED HEMOGLOBIN (HGB A1C): Hemoglobin A1C: 6 % — AB (ref 4.0–5.6)

## 2021-03-03 NOTE — Progress Notes (Signed)
Established Patient Office Visit  Subjective:  Patient ID: Kristen Gay, female    DOB: 09-25-1950  Age: 70 y.o. MRN: [id number]  CC:  Chief Complaint  Patient presents with   Hypertension   abnormal glucose    HPI Kristen Gay presents for   Hypertension- Pt denies chest pain, SOB, dizziness, or heart palpitations.  Taking meds as directed w/o problems.  Denies medication side effects.    Impaired fasting glucose-no increased thirst or urination. No symptoms consistent with hypoglycemia.   Past Medical History:  Diagnosis Date   Hypercholesteremia     Past Surgical History:  Procedure Laterality Date   APPENDECTOMY     carpel tunnel release Bilateral     Family History  Problem Relation Age of Onset   Osler-Weber-Rendu syndrome Sister    Hearing loss Sister    Peripheral vascular disease Sister    Heart Problems Sister        has a pacemaker    Social History   Socioeconomic History   Marital status: Married    Spouse name: Archie   Number of children: 1   Years of education: 12   Highest education level: 12th grade  Occupational History   Occupation: statitical asst.    Comment: retired  Tobacco Use   Smoking status: Every Day    Packs/day: 0.25    Years: 30.00    Pack years: 7.50    Types: Cigarettes   Smokeless tobacco: Never   Tobacco comments:    previously smoked 1/2 ppd  Vaping Use   Vaping Use: Never used  Substance and Sexual Activity   Alcohol use: Yes    Alcohol/week: 0.0 standard drinks    Comment: occassionally   Drug use: Never   Sexual activity: Not Currently    Partners: Male  Other Topics Concern   Not on file  Social History Narrative   Lives with her husband. Drinks 1 cup of coffee a day   Sister just passed away in 10-22-18 and still misses her. Her hobbies include doing yard work, Education administrator and organizing her house and reading.   Social Determinants of Health   Financial Resource Strain: Low Risk    Difficulty of Paying  Living Expenses: Not hard at all  Food Insecurity: No Food Insecurity   Worried About Charity fundraiser in the Last Year: Never true   [employer] in the Last Year: Never true  Transportation Needs: No Transportation Needs   Lack of Transportation (Medical): No   Lack of Transportation (Non-Medical): No  Physical Activity: Inactive   Days of Exercise per Week: 0 days   Minutes of Exercise per Session: 0 min  Stress: No Stress Concern Present   Feeling of Stress : Not at all  Social Connections: Moderately Isolated   Frequency of Communication with Friends and Family: More than three times a week   Frequency of Social Gatherings with Friends and Family: Three times a week   Attends Religious Services: Never   Active Member of Clubs or Organizations: No   Attends Archivist Meetings: Never   Marital Status: Married  Human resources officer Violence: Not At Risk   Fear of Current or Ex-Partner: No   Emotionally Abused: No   Physically Abused: No   Sexually Abused: No    Outpatient Medications Prior to Visit  Medication Sig Dispense Refill   rosuvastatin (CRESTOR) 20 MG tablet Take 1 tablet (20 mg total) by mouth daily. 90 tablet  3   No facility-administered medications prior to visit.    Allergies  Allergen Reactions   Codeine Other (See Comments)    Headache   Cortisone     ROS Review of Systems    Objective:    Physical Exam Constitutional:      Appearance: Normal appearance. She is well-developed.  HENT:     Head: Normocephalic and atraumatic.  Cardiovascular:     Rate and Rhythm: Normal rate and regular rhythm.     Heart sounds: Normal heart sounds.  Pulmonary:     Effort: Pulmonary effort is normal.     Breath sounds: Normal breath sounds.  Skin:    General: Skin is warm and dry.  Neurological:     Mental Status: She is alert and oriented to person, place, and time.  Psychiatric:        Behavior: Behavior normal.    BP (!) 147/65   Pulse 75    Ht '5\' 7"'$  (1.702 m)   Wt 143 lb (64.9 kg)   SpO2 97%   BMI 22.40 kg/m  Wt Readings from Last 3 Encounters:  03/03/21 143 lb (64.9 kg)  08/27/20 155 lb 9.6 oz (70.6 kg)  04/20/19 142 lb (64.4 kg)     Health Maintenance Due  Topic Date Due   Zoster Vaccines- Shingrix (1 of 2) Never done   INFLUENZA VACCINE  02/17/2021    There are no preventive care reminders to display for this patient.  Lab Results  Component Value Date   TSH 1.82 12/21/2012   Lab Results  Component Value Date   WBC 5.7 08/27/2020   HGB 14.3 08/27/2020   HCT 41.0 08/27/2020   MCV 92.3 08/27/2020   PLT 259 08/27/2020   Lab Results  Component Value Date   NA 142 08/27/2020   K 4.6 08/27/2020   CO2 30 08/27/2020   GLUCOSE 126 (H) 08/27/2020   BUN 10 08/27/2020   CREATININE 0.67 08/27/2020   BILITOT 0.6 08/27/2020   ALKPHOS 82 11/06/2015   AST 21 08/27/2020   ALT 16 08/27/2020   PROT 8.1 08/27/2020   ALBUMIN 4.1 11/06/2015   CALCIUM 10.0 08/27/2020   Lab Results  Component Value Date   CHOL 239 (H) 08/27/2020   Lab Results  Component Value Date   HDL 32 (L) 08/27/2020   Lab Results  Component Value Date   LDLCALC 159 (H) 08/27/2020   Lab Results  Component Value Date   TRIG 314 (H) 08/27/2020   Lab Results  Component Value Date   CHOLHDL 7.5 (H) 08/27/2020   Lab Results  Component Value Date   HGBA1C 6.0 (A) 03/03/2021      Assessment & Plan:   Problem List Items Addressed This Visit       Cardiovascular and Mediastinum   HTN (hypertension)    Reports home blood pressures look good.  Elevated here today.      Relevant Orders   COMPLETE METABOLIC PANEL WITH GFR   Lipid panel     Endocrine   IFG (impaired fasting glucose)    A1c looks good today at 6.0 continue to work on healthy diet and regular exercise.  Plan to follow-up in 6 months.  Lab Results  Component Value Date   HGBA1C 6.0 (A) 03/03/2021         Relevant Orders   COMPLETE METABOLIC PANEL WITH  GFR   Lipid panel     Other   TOBACCO USE  Still struggling with smoking she quit for short period of time but with her husband smoking it just makes it more difficult just encouraged her to work on at least cutting back and even considering quitting again.  It could make a big difference in her blood pressure levels as well.      Hyperlipidemia LDL goal <130    Currently on Crestor.  Tolerating medication well.  Plan to recheck lipids as last LDL was still greater than 100.  Lab Results  Component Value Date   CHOL 239 (H) 08/27/2020   HDL 32 (L) 08/27/2020   LDLCALC 159 (H) 08/27/2020   LDLDIRECT 146 (H) 11/07/2007   TRIG 314 (H) 08/27/2020   CHOLHDL 7.5 (H) 08/27/2020         Relevant Orders   COMPLETE METABOLIC PANEL WITH GFR   Lipid panel   Other Visit Diagnoses     Abnormal glucose    -  Primary   Relevant Orders   POCT glycosylated hemoglobin (Hb A1C) (Completed)   COMPLETE METABOLIC PANEL WITH GFR   Lipid panel       No orders of the defined types were placed in this encounter.   Follow-up: Return in about 6 months (around 09/03/2021) for Hypertension.    Beatrice Lecher, MD

## 2021-03-05 ENCOUNTER — Encounter: Payer: Self-pay | Admitting: Family Medicine

## 2021-03-05 NOTE — Assessment & Plan Note (Signed)
Still struggling with smoking she quit for short period of time but with her husband smoking it just makes it more difficult just encouraged her to work on at least cutting back and even considering quitting again.  It could make a big difference in her blood pressure levels as well.

## 2021-03-05 NOTE — Assessment & Plan Note (Signed)
Currently on Crestor.  Tolerating medication well.  Plan to recheck lipids as last LDL was still greater than 100.  Lab Results  Component Value Date   CHOL 239 (H) 08/27/2020   HDL 32 (L) 08/27/2020   LDLCALC 159 (H) 08/27/2020   LDLDIRECT 146 (H) 11/07/2007   TRIG 314 (H) 08/27/2020   CHOLHDL 7.5 (H) 08/27/2020

## 2021-03-05 NOTE — Assessment & Plan Note (Signed)
Reports home blood pressures look good.  Elevated here today.

## 2021-03-05 NOTE — Assessment & Plan Note (Signed)
A1c looks good today at 6.0 continue to work on healthy diet and regular exercise.  Plan to follow-up in 6 months.  Lab Results  Component Value Date   HGBA1C 6.0 (A) 03/03/2021

## 2021-03-27 DIAGNOSIS — Z23 Encounter for immunization: Secondary | ICD-10-CM | POA: Diagnosis not present

## 2021-09-03 ENCOUNTER — Ambulatory Visit: Payer: Medicare Other | Admitting: Family Medicine

## 2021-09-29 ENCOUNTER — Other Ambulatory Visit: Payer: Self-pay

## 2021-09-29 DIAGNOSIS — N281 Cyst of kidney, acquired: Secondary | ICD-10-CM

## 2021-09-29 DIAGNOSIS — J449 Chronic obstructive pulmonary disease, unspecified: Secondary | ICD-10-CM

## 2021-09-29 DIAGNOSIS — E785 Hyperlipidemia, unspecified: Secondary | ICD-10-CM

## 2021-09-29 DIAGNOSIS — M8580 Other specified disorders of bone density and structure, unspecified site: Secondary | ICD-10-CM

## 2021-09-29 MED ORDER — ROSUVASTATIN CALCIUM 20 MG PO TABS: 20.0000 mg | ORAL_TABLET | Freq: Every day | ORAL | 1 refills | Status: DC

## 2021-11-05 ENCOUNTER — Encounter: Payer: Self-pay | Admitting: Family Medicine

## 2021-11-05 ENCOUNTER — Ambulatory Visit (INDEPENDENT_AMBULATORY_CARE_PROVIDER_SITE_OTHER): Payer: Medicare Other | Admitting: Family Medicine

## 2021-11-05 VITALS — BP 144/78 | HR 87 | Resp 16 | Ht 67.0 in | Wt 152.0 lb

## 2021-11-05 DIAGNOSIS — I10 Essential (primary) hypertension: Secondary | ICD-10-CM

## 2021-11-05 DIAGNOSIS — R7301 Impaired fasting glucose: Secondary | ICD-10-CM

## 2021-11-05 DIAGNOSIS — F439 Reaction to severe stress, unspecified: Secondary | ICD-10-CM

## 2021-11-05 NOTE — Assessment & Plan Note (Signed)
Blood pressure significantly elevated today and it was elevated at last visit.  The last year she had some good blood pressures.  She is planning on getting a new home blood pressure cuff so encouraged her to check at home over the next couple weeks and then come back in in 2 weeks for nurse visit.  Bring home cuff as well as home blood pressure numbers and then we can decide if we need to start a medication at that point in time she is open to it. ?

## 2021-11-05 NOTE — Assessment & Plan Note (Signed)
Due for A1c today we will get that drawn with blood work.  At the time when her A1c was elevated she had been drinking 5 to 6 cups of juice a day.  So she has cut back on that. ?

## 2021-11-05 NOTE — Assessment & Plan Note (Signed)
She is still really struggling with a fall out with a childhood friend and just cannot quite get closure on it and now that the friend is passed she cannot address it directly.  She knows she needs to work on just being able to let it go and really reduce her stress levels but its been difficult. ?

## 2021-11-05 NOTE — Progress Notes (Addendum)
? ?Established Patient Office Visit ? ?Subjective   ?Patient ID: Kristen Gay, female    DOB: 1951-05-25  Age: 71 y.o. MRN: [id number] ? ?Chief Complaint  ?Patient presents with  ? Hypertension  ?  Follow up   ? ? ?HPI ?Hyperlipidemia - tolerating stating well with no myalgias or significant side effects.  She has started walking again 3 to 4 days a week, sometimes every day during the week. ?Lab Results  ?Component Value Date  ? CHOL 239 (H) 08/27/2020  ? HDL 32 (L) 08/27/2020  ? LDLCALC 159 (H) 08/27/2020  ? LDLDIRECT 146 (H) 11/07/2007  ? TRIG 314 (H) 08/27/2020  ? CHOLHDL 7.5 (H) 08/27/2020  ? ? ?Blood pressure is elevated today she says she does try to eat a low-salt diet. ? ?Impaired fasting glucose-no increased thirst or urination. No symptoms consistent with hypoglycemia. ? ?Been under some stress over the last several years since 10/20/2017.  She basically had a fall out with a childhood friend and just still feels really angry over it.  She says its to the point that she will wake up in the middle the night and then just instantly start thinking about it and feel really angry she just has not been able to really get past it or let go of it just really been bothering her.  The childhood friend actually passed away in 10/20/2017 and so really just has not had any closure. ? ? ? ?ROS ? ?  ?Objective:  ?  ? ?BP (!) 152/79   Pulse 87   Resp 16   Ht '5\' 7"'$  (1.702 m)   Wt 152 lb (68.9 kg)   SpO2 96%   BMI 23.81 kg/m?  ? ? ?Physical Exam ?Vitals and nursing note reviewed.  ?Constitutional:   ?   Appearance: She is well-developed.  ?HENT:  ?   Head: Normocephalic and atraumatic.  ?Cardiovascular:  ?   Rate and Rhythm: Normal rate and regular rhythm.  ?   Heart sounds: Normal heart sounds.  ?Pulmonary:  ?   Effort: Pulmonary effort is normal.  ?   Breath sounds: Normal breath sounds.  ?Skin: ?   General: Skin is warm and dry.  ?Neurological:  ?   Mental Status: She is alert and oriented to person, place, and time.   ?Psychiatric:     ?   Behavior: Behavior normal.  ? ? ? ?No results found for any visits on 11/05/21. ? ? ? ?The 10-year ASCVD risk score (Arnett DK, et al., 10/20/17) is: 34.7% ? ?  ?Assessment & Plan:  ? ?Problem List Items Addressed This Visit   ? ?  ? Cardiovascular and Mediastinum  ? HTN (hypertension)  ?  Blood pressure significantly elevated today and it was elevated at last visit.  The last year she had some good blood pressures.  She is planning on getting a new home blood pressure cuff so encouraged her to check at home over the next couple weeks and then come back in in 2 weeks for nurse visit.  Bring home cuff as well as home blood pressure numbers and then we can decide if we need to start a medication at that point in time she is open to it. ? ?  ?  ? Relevant Orders  ? Lipid Panel w/reflex Direct LDL  ? COMPLETE METABOLIC PANEL WITH GFR  ? CBC  ? Hemoglobin A1c  ?  ? Endocrine  ? IFG (impaired fasting glucose) - Primary  ?  Due for A1c today we will get that drawn with blood work.  At the time when her A1c was elevated she had been drinking 5 to 6 cups of juice a day.  So she has cut back on that. ? ?  ?  ? Relevant Orders  ? Lipid Panel w/reflex Direct LDL  ? COMPLETE METABOLIC PANEL WITH GFR  ? CBC  ? Hemoglobin A1c  ?  ? Other  ? Stress  ?  She is still really struggling with a fall out with a childhood friend and just cannot quite get closure on it and now that the friend is passed she cannot address it directly.  She knows she needs to work on just being able to let it go and really reduce her stress levels but its been difficult. ? ?  ?  ? ? ?Return in about 2 weeks (around 11/19/2021) for Nurse visit for BP .  ? ? ?Beatrice Lecher, MD ? ?

## 2021-11-06 LAB — COMPLETE METABOLIC PANEL WITH GFR
AG Ratio: 1.2 (calc) (ref 1.0–2.5)
ALT: 15 U/L (ref 6–29)
AST: 20 U/L (ref 10–35)
Albumin: 4.2 g/dL (ref 3.6–5.1)
Alkaline phosphatase (APISO): 73 U/L (ref 37–153)
BUN: 10 mg/dL (ref 7–25)
CO2: 27 mmol/L (ref 20–32)
Calcium: 9.6 mg/dL (ref 8.6–10.4)
Chloride: 107 mmol/L (ref 98–110)
Creat: 0.69 mg/dL (ref 0.60–1.00)
Globulin: 3.6 g/dL (calc) (ref 1.9–3.7)
Glucose, Bld: 120 mg/dL (ref 65–139)
Potassium: 4.2 mmol/L (ref 3.5–5.3)
Sodium: 141 mmol/L (ref 135–146)
Total Bilirubin: 0.6 mg/dL (ref 0.2–1.2)
Total Protein: 7.8 g/dL (ref 6.1–8.1)
eGFR: 92 mL/min/{1.73_m2} (ref >=60)

## 2021-11-06 LAB — CBC
HCT: 41.1 % (ref 35.0–45.0)
Hemoglobin: 14 g/dL (ref 11.7–15.5)
MCH: 32 pg (ref 27.0–33.0)
MCHC: 34.1 g/dL (ref 32.0–36.0)
MCV: 94.1 fL (ref 80.0–100.0)
MPV: 12 fL (ref 7.5–12.5)
Platelets: 273 10*3/uL (ref 140–400)
RBC: 4.37 10*6/uL (ref 3.80–5.10)
RDW: 13.1 % (ref 11.0–15.0)
WBC: 5.7 10*3/uL (ref 3.8–10.8)

## 2021-11-06 LAB — HEMOGLOBIN A1C
Hgb A1c MFr Bld: 6.3 % of total Hgb — ABNORMAL HIGH (ref <5.7)
Mean Plasma Glucose: 134 mg/dL
eAG (mmol/L): 7.4 mmol/L

## 2021-11-06 LAB — LIPID PANEL W/REFLEX DIRECT LDL
Cholesterol: 126 mg/dL (ref <200)
HDL: 32 mg/dL — ABNORMAL LOW (ref >=50)
LDL Cholesterol (Calc): 71 mg/dL (calc)
Non-HDL Cholesterol (Calc): 94 mg/dL (calc) (ref <130)
Total CHOL/HDL Ratio: 3.9 (calc) (ref <5.0)
Triglycerides: 152 mg/dL — ABNORMAL HIGH (ref <150)

## 2021-11-06 NOTE — Progress Notes (Signed)
Call patient: LDL cholesterol looks much better its fantastic it was 159 last year it is now down to 71 which is perfect A1c still in the prediabetes range but it did go up slightly from 6.0-6.3.  Remember that 6.5 is diabetes.  So just encourage you to continue to work on healthy food choices and moderating carbohydrates and sugars and increasing vegetables.  Your blood count metabolic panel look great.

## 2021-11-19 ENCOUNTER — Ambulatory Visit: Payer: Medicare Other

## 2021-11-25 ENCOUNTER — Ambulatory Visit (INDEPENDENT_AMBULATORY_CARE_PROVIDER_SITE_OTHER): Payer: Medicare Other | Admitting: Family Medicine

## 2021-11-25 DIAGNOSIS — Z0000 Encounter for general adult medical examination without abnormal findings: Secondary | ICD-10-CM

## 2021-11-25 DIAGNOSIS — Z1231 Encounter for screening mammogram for malignant neoplasm of breast: Secondary | ICD-10-CM

## 2021-11-25 NOTE — Patient Instructions (Addendum)
?MEDICARE ANNUAL WELLNESS VISIT ?Health Maintenance Summary and Written Plan of Care ? ?Ms. Kristen Gay , ? ?Thank you for allowing me to perform your Medicare Annual Wellness Visit and for your ongoing commitment to your health.  ? ?Health Maintenance & Immunization History ?Health Maintenance  ?Topic Date Due  ? COVID-19 Vaccine (4 - Booster for Moderna series) 12/11/2021 (Originally 01/01/2021)  ? Zoster Vaccines- Shingrix (1 of 2) 02/04/2022 (Originally 09/22/1998)  ? Pneumonia Vaccine 52+ Years old (3 - PPSV23 if available, else PCV20) 11/06/2022 (Originally 11/06/2015)  ? DEXA SCAN  11/26/2022 (Originally 09/21/2013)  ? INFLUENZA VACCINE  02/17/2022  ? MAMMOGRAM  02/26/2022  ? COLONOSCOPY (Pts 45-73yr Insurance coverage will need to be confirmed)  05/17/2024  ? TETANUS/TDAP  12/18/2024  ? Hepatitis C Screening  Completed  ? HPV VACCINES  Aged Out  ? ?Immunization History  ?Administered Date(s) Administered  ? Influenza Whole 04/30/2006  ? Influenza, High Dose Seasonal PF 07/01/2017, 06/02/2018  ? Influenza,inj,Quad PF,6+ Mos 05/14/2014, 04/19/2017  ? Moderna SARS-COV2 Booster Vaccination 11/06/2020  ? Moderna Sars-Covid-2 Vaccination 08/26/2019, 09/23/2019, 05/15/2020  ? Pneumococcal Conjugate-13 11/06/2014  ? Pneumococcal Polysaccharide-23 04/30/2006  ? Tdap 12/19/2014  ? Zoster, Live 12/19/2014  ? ? ?These are the patient goals that we discussed: ? Goals Addressed   ? ?  ?  ?  ?  ?  ? This Visit's Progress  ?   Patient Stated (pt-stated)     ?   Continue to maintain healthy habits. ?  ? ?  ?  ? ?This is a list of Health Maintenance Items that are overdue or due now: ?Pneumococcal vaccine  ?Screening mammography ?Bone densitometry screening ?Shingrix vaccine ?Lung cancer screening ?  ? ?Orders/Referrals Placed Today: ?Orders Placed This Encounter  ?Procedures  ? Mammogram 3D SCREEN BREAST BILATERAL  ?  Standing Status:   Future  ?  Standing Expiration Date:   11/26/2022  ?  Scheduling Instructions:  ?   Please call  patient to schedule.  ?  Order Specific Question:   Reason for Exam (SYMPTOM  OR DIAGNOSIS REQUIRED)  ?  Answer:   Breast cancer screening  ?  Order Specific Question:   Preferred imaging location?  ?  [facility]   [facility] ? ?(Contact our referral department at [phone] you have not spoken with someone about your referral appointment within the next 5 days)  ? ? ?Follow-up Plan ?Follow-up with MHali Marry MD as planned ?Schedule your Shingrix vaccine at the pharmacy.  ?Discuss pneumococcal vaccine with Dr. MMadilyn Fireman  ?Mammogram referral has been sent and they will call you to schedule. ?Let [place] if you change your mind about the bone density.  ?Medicare wellness visit in one year ?AVS printed and mailed to the patient. ? ? ?  ?Health Maintenance, Female ?Adopting a healthy lifestyle and getting preventive care are important in promoting health and wellness. Ask your health care provider about: ?The right schedule for you to have regular tests and exams. ?Things you can do on your own to prevent diseases and keep yourself healthy. ?What should I know about diet, weight, and exercise? ?Eat a healthy diet ? ?Eat a diet that includes plenty of vegetables, fruits, low-fat dairy products, and lean protein. ?Do not eat a lot of foods that are high in solid fats, added sugars, or sodium. ?Maintain a healthy weight ?Body mass index (BMI) is used to identify weight problems. It estimates body fat based on height and weight. Your health  care provider can help determine your BMI and help you achieve or maintain a healthy weight. ?Get regular exercise ?Get regular exercise. This is one of the most important things you can do for your health. Most adults should: ?Exercise for at least 150 minutes each week. The exercise should increase your heart rate and make you sweat (moderate-intensity exercise). ?Do strengthening exercises at least twice a week. This is in addition to the moderate-intensity  exercise. ?Spend less time sitting. Even light physical activity can be beneficial. ?Watch cholesterol and blood lipids ?Have your blood tested for lipids and cholesterol at 73 years of age, then have this test every 5 years. ?Have your cholesterol levels checked more often if: ?Your lipid or cholesterol levels are high. ?You are older than 73 years of age. ?You are at high risk for heart disease. ?What should I know about cancer screening? ?Depending on your health history and family history, you may need to have cancer screening at various ages. This may include screening for: ?Breast cancer. ?Cervical cancer. ?Colorectal cancer. ?Skin cancer. ?Lung cancer. ?What should I know about heart disease, diabetes, and high blood pressure? ?Blood pressure and heart disease ?High blood pressure causes heart disease and increases the risk of stroke. This is more likely to develop in people who have high blood pressure readings or are overweight. ?Have your blood pressure checked: ?Every 3-5 years if you are 27-12 years of age. ?Every year if you are 69 years old or older. ?Diabetes ?Have regular diabetes screenings. This checks your fasting blood sugar level. Have the screening done: ?Once every three years after age 54 if you are at a normal weight and have a low risk for diabetes. ?More often and at a younger age if you are overweight or have a high risk for diabetes. ?What should I know about preventing infection? ?Hepatitis B ?If you have a higher risk for hepatitis B, you should be screened for this virus. Talk with your health care provider to find out if you are at risk for hepatitis B infection. ?Hepatitis C ?Testing is recommended for: ?Everyone born from [birth year] through 1967. ?Anyone with known risk factors for hepatitis C. ?Sexually transmitted infections (STIs) ?Get screened for STIs, including gonorrhea and chlamydia, if: ?You are sexually active and are younger than 73 years of age. ?You are older than 73 years  of age and your health care provider tells you that you are at risk for this type of infection. ?Your sexual activity has changed since you were last screened, and you are at increased risk for chlamydia or gonorrhea. Ask your health care provider if you are at risk. ?Ask your health care provider about whether you are at high risk for HIV. Your health care provider may recommend a prescription medicine to help prevent HIV infection. If you choose to take medicine to prevent HIV, you should first get tested for HIV. You should then be tested every 3 months for as long as you are taking the medicine. ?Pregnancy ?If you are about to stop having your period (premenopausal) and you may become pregnant, seek counseling before you get pregnant. ?Take 400 to 800 micrograms (mcg) of folic acid every day if you become pregnant. ?Ask for birth control (contraception) if you want to prevent pregnancy. ?Osteoporosis and menopause ?Osteoporosis is a disease in which the bones lose minerals and strength with aging. This can result in bone fractures. If you are 63 years old or older, or if you are at risk  for osteoporosis and fractures, ask your health care provider if you should: ?Be screened for bone loss. ?Take a calcium or vitamin D supplement to lower your risk of fractures. ?Be given hormone replacement therapy (HRT) to treat symptoms of menopause. ?Follow these instructions at home: ?Alcohol use ?Do not drink alcohol if: ?Your health care provider tells you not to drink. ?You are pregnant, may be pregnant, or are planning to become pregnant. ?If you drink alcohol: ?Limit how much you have to: ?0-1 drink a day. ?Know how much alcohol is in your drink. In the [place] one drink equals one 12 oz bottle of beer (355 mL), one 5 oz glass of wine (148 mL), or one 1? oz glass of hard liquor (44 mL). ?Lifestyle ?Do not use any products that contain nicotine or tobacco. These products include cigarettes, chewing tobacco, and vaping  devices, such as e-cigarettes. If you need help quitting, ask your health care provider. ?Do not use street drugs. ?Do not share needles. ?Ask your health care provider for help if you need support or informati

## 2021-11-25 NOTE — Progress Notes (Signed)
? ? ?MEDICARE ANNUAL WELLNESS VISIT ? ?11/25/2021 ? ?Telephone Visit Disclaimer ?This Medicare AWV was conducted by telephone due to national recommendations for restrictions regarding the COVID-19 Pandemic (e.g. social distancing).  I verified, using two identifiers, that I am speaking with Kristen Gay or their authorized healthcare agent. I discussed the limitations, risks, security, and privacy concerns of performing an evaluation and management service by telephone and the potential availability of an in-person appointment in the future. The patient expressed understanding and agreed to proceed.  ?Location of Patient: Home ?Location of Provider (nurse):  In the office. ? ?Subjective:  ? ? ?Kristen Gay is a 71 y.o. female patient of Metheney, Rene Kocher, MD who had a Medicare Annual Wellness Visit today via telephone. Kristen Gay is Retired and lives with their spouse. she has 1 child. she reports that she is socially active and does interact with friends/family regularly. she is minimally physically active and enjoys watching television, organizing her house and reading. ? ?Patient Care Team: ?Hali Marry, MD as PCP - General (Family Medicine) ?Christin Fudge., MD as Referring Physician (Neurology) ? ? ?  11/25/2021  ?  9:40 AM 11/08/2020  ?  3:03 PM 10/26/2018  ? 11:12 AM 11/06/2015  ?  1:06 PM 11/06/2014  ?  1:16 PM  ?Advanced Directives  ?Does Patient Have a Medical Advance Directive? No No No No No  ?Would patient like information on creating a medical advance directive? No - Patient declined No - Patient declined No - Patient declined No - patient declined information Yes - Educational materials given  ? ? ?Hospital Utilization Over the Past 12 Months: ?# of hospitalizations or ER visits: 0 ?# of surgeries: 0 ? ?Review of Systems    ?Patient reports that her overall health is unchanged compared to last year. ? ?History obtained from chart review and the patient ? ?Patient Reported Readings (BP, Pulse,  CBG, Weight, etc) ?none ? ?Pain Assessment ?Pain : No/denies pain ? ?  ? ?Current Medications & Allergies (verified) ?Allergies as of 11/25/2021   ? ?   Reactions  ? Codeine Other (See Comments)  ? Headache  ? Cortisone   ? ?  ? ?  ?Medication List  ?  ? ?  ? Accurate as of Nov 25, 2021 10:14 AM. If you have any questions, ask your nurse or doctor.  ?  ?  ? ?  ? ?rosuvastatin 20 MG tablet ?Commonly known as: CRESTOR ?Take 1 tablet (20 mg total) by mouth daily. ?  ? ?  ? ? ?History (reviewed): ?Past Medical History:  ?Diagnosis Date  ? Hypercholesteremia   ? ?Past Surgical History:  ?Procedure Laterality Date  ? APPENDECTOMY    ? carpel tunnel release Bilateral   ? ?Family History  ?Problem Relation Age of Onset  ? Osler-Weber-Rendu syndrome Sister   ? Hearing loss Sister   ? Peripheral vascular disease Sister   ? Heart Problems Sister   ?     has a pacemaker  ? ?Social History  ? ?Socioeconomic History  ? Marital status: Married  ?  Spouse name: Kristen Gay  ? Number of children: 1  ? Years of education: 36  ? Highest education level: 12th grade  ?Occupational History  ? Occupation: statitical asst.  ?  Comment: retired  ?Tobacco Use  ? Smoking status: Every Day  ?  Packs/day: 0.50  ?  Years: 30.00  ?  Pack years: 15.00  ?  Types: Cigarettes  ? Smokeless tobacco:  Never  ? Tobacco comments:  ?  1/2 pack a day   ?Vaping Use  ? Vaping Use: Never used  ?Substance and Sexual Activity  ? Alcohol use: Yes  ?  Alcohol/week: 0.0 standard drinks  ?  Comment: occassionally  ? Drug use: Never  ? Sexual activity: Not Currently  ?  Partners: Male  ?Other Topics Concern  ? Not on file  ?Social History Narrative  ? Lives with her husband. Sister just passed away in Oct 15, 2018 and still misses her. Her hobbies include doing watching television, organizing her house and reading.  ? ?Social Determinants of Health  ? ?Financial Resource Strain: Low Risk   ? Difficulty of Paying Living Expenses: Not hard at all  ?Food Insecurity: No Food Insecurity   ? Worried About Charity fundraiser in the Last Year: Never true  ? Ran Out of Food in the Last Year: Never true  ?Transportation Needs: No Transportation Needs  ? Lack of Transportation (Medical): No  ? Lack of Transportation (Non-Medical): No  ?Physical Activity: Sufficiently Active  ? Days of Exercise per Week: 3 days  ? Minutes of Exercise per Session: 60 min  ?Stress: No Stress Concern Present  ? Feeling of Stress : Not at all  ?Social Connections: Moderately Isolated  ? Frequency of Communication with Friends and Family: More than three times a week  ? Frequency of Social Gatherings with Friends and Family: Once a week  ? Attends Religious Services: Never  ? Active Member of Clubs or Organizations: No  ? Attends Archivist Meetings: Never  ? Marital Status: Married  ? ? ?Activities of Daily Living ? ?  11/25/2021  ?  9:52 AM  ?In your present state of health, do you have any difficulty performing the following activities:  ?Hearing? 0  ?Vision? 0  ?Difficulty concentrating or making decisions? 0  ?Walking or climbing stairs? 0  ?Dressing or bathing? 0  ?Doing errands, shopping? 0  ?Preparing Food and eating ? N  ?Using the Toilet? N  ?In the past six months, have you accidently leaked urine? N  ?Do you have problems with loss of bowel control? N  ?Managing your Medications? N  ?Managing your Finances? N  ?Housekeeping or managing your Housekeeping? N  ? ? ?Patient Education/ Literacy ?How often do you need to have someone help you when you read instructions, pamphlets, or other written materials from your doctor or pharmacy?: 1 - Never ?What is the last grade level you completed in school?: 12th grade and some college courses. ? ?Exercise ?Current Exercise Habits: Home exercise routine, Type of exercise: walking, Time (Minutes): 60, Frequency (Times/Week): 3, Weekly Exercise (Minutes/Week): 180, Intensity: Moderate, Exercise limited by: None identified ? ?Diet ?Patient reports consuming  2-3  meals a  day and 0 snack(s) a day ?Patient reports that her primary diet is: Regular ?Patient reports that she does have regular access to food.  ? ?Depression Screen ? ?  11/25/2021  ?  9:41 AM 11/05/2021  ? 10:07 AM 03/03/2021  ? 11:52 AM 11/08/2020  ?  3:03 PM 10/26/2018  ? 11:15 AM 10/19/2017  ? 10:47 AM 11/06/2015  ?  1:06 PM  ?PHQ 2/9 Scores  ?PHQ - 2 Score 0 0 2 0 1 0 0  ?PHQ- 9 Score   4      ?  ? ?Fall Risk ? ?  11/25/2021  ?  9:40 AM 11/05/2021  ? 10:06 AM 03/03/2021  ? 11:52 AM  11/08/2020  ?  3:03 PM 10/26/2018  ? 11:15 AM  ?Fall Risk   ?Falls in the past year? 0 0 0 0 0  ?Number falls in past yr: 0 0 0 0   ?Injury with Fall? 0 0 0 0   ?Risk for fall due to : No Fall Risks No Fall Risks  No Fall Risks   ?Follow up Falls evaluation completed Falls prevention discussed;Falls evaluation completed  Falls evaluation completed Falls prevention discussed  ? ?  ?Objective:  ?Kristen Gay seemed alert and oriented and she participated appropriately during our telephone visit. ? ?Blood Pressure Weight BMI  ?BP Readings from Last 3 Encounters:  ?11/05/21 (!) 144/78  ?03/03/21 (!) 147/65  ?08/27/20 137/86  ? Wt Readings from Last 3 Encounters:  ?11/05/21 152 lb (68.9 kg)  ?03/03/21 143 lb (64.9 kg)  ?08/27/20 155 lb 9.6 oz (70.6 kg)  ? BMI Readings from Last 1 Encounters:  ?11/05/21 23.81 kg/m?  ?  ?*Unable to obtain current vital signs, weight, and BMI due to telephone visit type ? ?Hearing/Vision  ?Yaneli did not seem to have difficulty with hearing/understanding during the telephone conversation ?Reports that she has not had a formal eye exam by an eye care professional within the past year ?Reports that she has not had a formal hearing evaluation within the past year ?*Unable to fully assess hearing and vision during telephone visit type ? ?Cognitive Function: ? ?  11/25/2021  ?  9:54 AM 11/08/2020  ?  3:15 PM 10/26/2018  ? 11:19 AM 10/19/2017  ? 10:46 AM  ?6CIT Screen  ?What Year? 0 points 0 points 0 points 0 points  ?What month? 0 points 0  points 0 points 0 points  ?What time? 0 points 0 points 0 points 0 points  ?Count back from 20 0 points 0 points 0 points 0 points  ?Months in reverse 0 points 0 points 0 points 2 points  ?Repeat phrase 0 p

## 2021-12-08 DIAGNOSIS — Z23 Encounter for immunization: Secondary | ICD-10-CM | POA: Diagnosis not present

## 2021-12-11 ENCOUNTER — Ambulatory Visit (INDEPENDENT_AMBULATORY_CARE_PROVIDER_SITE_OTHER): Payer: Medicare Other

## 2021-12-11 DIAGNOSIS — Z1231 Encounter for screening mammogram for malignant neoplasm of breast: Secondary | ICD-10-CM | POA: Diagnosis not present

## 2021-12-11 DIAGNOSIS — Z0000 Encounter for general adult medical examination without abnormal findings: Secondary | ICD-10-CM

## 2021-12-18 NOTE — Progress Notes (Signed)
Please call patient. Normal mammogram.  Repeat in 1 year.  

## 2022-02-02 ENCOUNTER — Ambulatory Visit: Payer: Medicare Other | Admitting: Family Medicine

## 2022-04-08 DIAGNOSIS — Z23 Encounter for immunization: Secondary | ICD-10-CM | POA: Diagnosis not present

## 2022-04-21 ENCOUNTER — Other Ambulatory Visit: Payer: Self-pay | Admitting: *Deleted

## 2022-04-21 DIAGNOSIS — E785 Hyperlipidemia, unspecified: Secondary | ICD-10-CM

## 2022-04-21 DIAGNOSIS — N281 Cyst of kidney, acquired: Secondary | ICD-10-CM

## 2022-04-21 DIAGNOSIS — J449 Chronic obstructive pulmonary disease, unspecified: Secondary | ICD-10-CM

## 2022-04-21 DIAGNOSIS — M8580 Other specified disorders of bone density and structure, unspecified site: Secondary | ICD-10-CM

## 2022-04-21 MED ORDER — ROSUVASTATIN CALCIUM 20 MG PO TABS: 20.0000 mg | ORAL_TABLET | Freq: Every day | ORAL | 1 refills | Status: DC

## 2022-04-23 ENCOUNTER — Telehealth: Payer: Self-pay

## 2022-04-23 DIAGNOSIS — J449 Chronic obstructive pulmonary disease, unspecified: Secondary | ICD-10-CM

## 2022-04-23 DIAGNOSIS — M8580 Other specified disorders of bone density and structure, unspecified site: Secondary | ICD-10-CM

## 2022-04-23 DIAGNOSIS — E785 Hyperlipidemia, unspecified: Secondary | ICD-10-CM

## 2022-04-23 DIAGNOSIS — N281 Cyst of kidney, acquired: Secondary | ICD-10-CM

## 2022-04-23 MED ORDER — ROSUVASTATIN CALCIUM 20 MG PO TABS: 20.0000 mg | ORAL_TABLET | Freq: Every day | ORAL | 1 refills | Status: DC

## 2022-04-23 NOTE — Telephone Encounter (Signed)
Meds sent. Please heave her schedule a 6 mo f/u in November

## 2022-04-23 NOTE — Telephone Encounter (Signed)
She's scheduled 05/21/2022. - tvt

## 2022-04-23 NOTE — Telephone Encounter (Signed)
Pt stopped by the office and stated that she is out of meds and wanted to see if you would send in a Rx for her?    rosuvastatin (CRESTOR) 20 MG tablet.   Pt would like a call once it's completed at [phone]

## 2022-05-21 ENCOUNTER — Encounter: Payer: Self-pay | Admitting: Family Medicine

## 2022-05-21 ENCOUNTER — Ambulatory Visit (INDEPENDENT_AMBULATORY_CARE_PROVIDER_SITE_OTHER): Payer: Medicare Other | Admitting: Family Medicine

## 2022-05-21 VITALS — BP 152/68 | HR 86 | Ht 67.0 in | Wt 144.0 lb

## 2022-05-21 DIAGNOSIS — I10 Essential (primary) hypertension: Secondary | ICD-10-CM | POA: Diagnosis not present

## 2022-05-21 DIAGNOSIS — F17200 Nicotine dependence, unspecified, uncomplicated: Secondary | ICD-10-CM | POA: Diagnosis not present

## 2022-05-21 DIAGNOSIS — R7301 Impaired fasting glucose: Secondary | ICD-10-CM | POA: Diagnosis not present

## 2022-05-21 LAB — POCT GLYCOSYLATED HEMOGLOBIN (HGB A1C): Hemoglobin A1C: 6.3 % — AB (ref 4.0–5.6)

## 2022-05-21 MED ORDER — IBUPROFEN-FAMOTIDINE 800-26.6 MG PO TABS: 1.0000 | ORAL_TABLET | Freq: Every day | ORAL | 3 refills | Status: AC

## 2022-05-21 MED ORDER — LOSARTAN POTASSIUM-HCTZ 50-12.5 MG PO TABS: 1.0000 | ORAL_TABLET | Freq: Every day | ORAL | 1 refills | Status: DC

## 2022-05-21 NOTE — Assessment & Plan Note (Signed)
A1c today slightly elevated at 6.3.  Encouraged her to continue to work on healthy diet and regular exercise.  Lab Results  Component Value Date   HGBA1C 6.3 (A) 05/21/2022

## 2022-05-21 NOTE — Progress Notes (Signed)
Established Patient Office Visit  Subjective   Patient ID: Kristen Gay, female    DOB: 1951-01-14  Age: 71 y.o. MRN: [id number]  Chief Complaint  Patient presents with   Hypertension   Hyperlipidemia    HPI  Hypertension- Pt denies chest pain, SOB, dizziness, or heart palpitations.  Taking meds as directed w/o problems.  Denies medication side effects.  Struggles to leave out the salt.    Impaired fasting glucose-no increased thirst or urination. No symptoms consistent with hypoglycemia.  Tobacco abuse-she is cut down to 4 to 5 cigarettes/day and is doing well.    ROS    Objective:     BP (!) 152/68   Pulse 86   Ht '5\' 7"'$  (1.702 m)   Wt 144 lb 0.6 oz (65.3 kg)   SpO2 94%   BMI 22.56 kg/m    Physical Exam Vitals and nursing note reviewed.  Constitutional:      Appearance: She is well-developed.  HENT:     Head: Normocephalic and atraumatic.  Cardiovascular:     Rate and Rhythm: Normal rate and regular rhythm.     Heart sounds: Normal heart sounds.  Pulmonary:     Effort: Pulmonary effort is normal.     Breath sounds: Normal breath sounds.  Skin:    General: Skin is warm and dry.  Neurological:     Mental Status: She is alert and oriented to person, place, and time.  Psychiatric:        Behavior: Behavior normal.     Results for orders placed or performed in visit on 05/21/22  POCT glycosylated hemoglobin (Hb A1C)  Result Value Ref Range   Hemoglobin A1C 6.3 (A) 4.0 - 5.6 %   HbA1c POC (<> result, manual entry)     HbA1c, POC (prediabetic range)     HbA1c, POC (controlled diabetic range)        The ASCVD Risk score (Arnett DK, et al., 2019) failed to calculate for the following reasons:   The valid total cholesterol range is 130 to 320 mg/dL    Assessment & Plan:   Problem List Items Addressed This Visit       Cardiovascular and Mediastinum   HTN (hypertension) - Primary   Relevant Medications   losartan-hydrochlorothiazide (HYZAAR)  50-12.5 MG tablet   Other Relevant Orders   BASIC METABOLIC PANEL WITH GFR     Endocrine   IFG (impaired fasting glucose)    A1c today slightly elevated at 6.3.  Encouraged her to continue to work on healthy diet and regular exercise.  Lab Results  Component Value Date   HGBA1C 6.3 (A) 05/21/2022        Relevant Orders   POCT glycosylated hemoglobin (Hb A1C) (Completed)   BASIC METABOLIC PANEL WITH GFR     Other   TOBACCO USE    It is fantastic that she has been able to cut back thus far.  Just encouraged her to continue to work at it until she is able to quit completely which would be fantastic.  It will help reduce her risk for cardiovascular disease as well as reduce her blood pressure.       Would like to get the shingles vaccine but I did encourage her to check with her insurance she does not have Medicare part D because she has [place] that pays for her medications.  So just 1 to make sure that she will get a large bill if she gets that  here but we do have it available.  Return in about 2 weeks (around 06/04/2022) for Nurse visit recheck BP.    Beatrice Lecher, MD

## 2022-05-21 NOTE — Assessment & Plan Note (Signed)
It is fantastic that she has been able to cut back thus far.  Just encouraged her to continue to work at it until she is able to quit completely which would be fantastic.  It will help reduce her risk for cardiovascular disease as well as reduce her blood pressure.

## 2022-05-22 LAB — BASIC METABOLIC PANEL WITH GFR
BUN: 13 mg/dL (ref 7–25)
CO2: 28 mmol/L (ref 20–32)
Calcium: 9.6 mg/dL (ref 8.6–10.4)
Chloride: 107 mmol/L (ref 98–110)
Creat: 0.69 mg/dL (ref 0.60–1.00)
Glucose, Bld: 115 mg/dL — ABNORMAL HIGH (ref 65–99)
Potassium: 4.3 mmol/L (ref 3.5–5.3)
Sodium: 141 mmol/L (ref 135–146)
eGFR: 92 mL/min/{1.73_m2} (ref >=60)

## 2022-06-04 ENCOUNTER — Ambulatory Visit (INDEPENDENT_AMBULATORY_CARE_PROVIDER_SITE_OTHER): Payer: Medicare Other | Admitting: Family Medicine

## 2022-06-04 ENCOUNTER — Telehealth: Payer: Self-pay

## 2022-06-04 VITALS — BP 125/75 | HR 89 | Ht 67.0 in | Wt 141.0 lb

## 2022-06-04 DIAGNOSIS — I10 Essential (primary) hypertension: Secondary | ICD-10-CM

## 2022-06-04 MED ORDER — LOSARTAN POTASSIUM 25 MG PO TABS: 25.0000 mg | ORAL_TABLET | Freq: Every day | ORAL | 1 refills | Status: DC

## 2022-06-04 NOTE — Progress Notes (Signed)
Agree with documentation as above.   Shala Baumbach, MD  

## 2022-06-04 NOTE — Telephone Encounter (Signed)
OK to schedule for nurse visit for both of these. I would recommend doing one at a time bc of inc risk of bodyaches after the vaccines

## 2022-06-04 NOTE — Progress Notes (Signed)
   Subjective:    Patient ID: Cleophas Dunker, female    DOB: 11-Mar-1951, 71 y.o.   MRN: [id number]  HPI Patient presents today for a blood pressure check per PCP. Denies any trouble sleeping, palpitations or medication problems. Patient brought in her log book and her at home machine for comparison. Her machine was 134/84.    Review of Systems     Objective:   Physical Exam        Assessment & Plan:  Per Dr. Madilyn Fireman, we will change the patient medication to Losartan '25mg'$  QD. This was sent to [facility] per patient request. Advised patient to continue to monitor her BP at home and get regular exercise.

## 2022-06-04 NOTE — Telephone Encounter (Signed)
Patient  was in for a nurse visit today. She stated that she spoke with her insurance and they informed her that she can get the vaccines (RSV and shingles) if they are recommended by the CDC. She cannot get these at her local pharmacy as she does not have MCR part C per the pharmacist.

## 2022-06-05 ENCOUNTER — Ambulatory Visit (INDEPENDENT_AMBULATORY_CARE_PROVIDER_SITE_OTHER): Payer: Medicare Other | Admitting: Family Medicine

## 2022-06-05 VITALS — Temp 97.8°F | Ht 67.0 in | Wt 141.0 lb

## 2022-06-05 DIAGNOSIS — Z23 Encounter for immunization: Secondary | ICD-10-CM

## 2022-06-05 NOTE — Progress Notes (Signed)
Agree with documentation as above.   Lonnel Gjerde, MD  

## 2022-06-05 NOTE — Progress Notes (Signed)
Patient is here today for 1st Shingles vaccine. Denies chest pain, shortness of breath, headaches and problems with medication or mood changes.   Patient tolerated injection well without complications.  Patient advised to schedule next injection at 6 month appt with Dr. Madilyn Fireman.

## 2022-08-24 ENCOUNTER — Ambulatory Visit (INDEPENDENT_AMBULATORY_CARE_PROVIDER_SITE_OTHER): Payer: Medicare Other | Admitting: Family Medicine

## 2022-08-24 VITALS — BP 130/69 | HR 67 | Ht 67.0 in

## 2022-08-24 DIAGNOSIS — Z23 Encounter for immunization: Secondary | ICD-10-CM

## 2022-08-24 NOTE — Progress Notes (Signed)
Agree with documentation as above.   Lanisha Stepanian, MD  

## 2022-08-24 NOTE — Progress Notes (Signed)
   Established Patient Office Visit  Subjective   Patient ID: Kristen Gay, female    DOB: [birth year]  Age: 72 y.o. MRN: [id number]  Chief Complaint  Patient presents with   2nd shingles vaccine    2nd shingles vaccine for shingles- nurse visit     HPI  2nd shingles vaccine- nurse visit.  Patient states initial shingles vaccine given on 06/05/22 caused s/e of  knott at injection site and itching that lasted several weeks.   ROS    Objective:     BP 130/69   Pulse 67   Ht '5\' 7"'$  (1.702 m)   SpO2 98%   BMI 22.08 kg/m    Physical Exam   No results found for any visits on 08/24/22.    The ASCVD Risk score (Arnett DK, et al., 2019) failed to calculate for the following reasons:   The valid total cholesterol range is 130 to 320 mg/dL    Assessment & Plan:  2nd shingles vaccine. Administered vaccine  right deltoid IM . Patient had some slight pain with injection. No other complications noted. Spoke with Dr. Madilyn Fireman regarding patient's previous reaction to initial Shingrix vaccine- Patient informed that  per Dr. Madilyn Fireman if this vaccine also caused itching to use OTC benadryl  and to call our office if develops any concerning side effects.   Problem List Items Addressed This Visit   None Visit Diagnoses     Need for shingles vaccine    -  Primary   Relevant Orders   Varicella-zoster vaccine IM (Completed)       No follow-ups on file.    Rae Lips, LPN

## 2022-09-14 ENCOUNTER — Ambulatory Visit: Payer: Medicare Other | Admitting: Family Medicine

## 2022-10-07 ENCOUNTER — Other Ambulatory Visit: Payer: Self-pay | Admitting: *Deleted

## 2022-10-07 DIAGNOSIS — M8580 Other specified disorders of bone density and structure, unspecified site: Secondary | ICD-10-CM

## 2022-10-07 DIAGNOSIS — E785 Hyperlipidemia, unspecified: Secondary | ICD-10-CM

## 2022-10-07 DIAGNOSIS — J449 Chronic obstructive pulmonary disease, unspecified: Secondary | ICD-10-CM

## 2022-10-07 DIAGNOSIS — N281 Cyst of kidney, acquired: Secondary | ICD-10-CM

## 2022-10-07 MED ORDER — ROSUVASTATIN CALCIUM 20 MG PO TABS: 20.0000 mg | ORAL_TABLET | Freq: Every day | ORAL | 1 refills | Status: DC

## 2022-12-01 ENCOUNTER — Ambulatory Visit (INDEPENDENT_AMBULATORY_CARE_PROVIDER_SITE_OTHER): Payer: Medicare Other | Admitting: Family Medicine

## 2022-12-01 DIAGNOSIS — Z1231 Encounter for screening mammogram for malignant neoplasm of breast: Secondary | ICD-10-CM

## 2022-12-01 DIAGNOSIS — Z0000 Encounter for general adult medical examination without abnormal findings: Secondary | ICD-10-CM

## 2022-12-01 NOTE — Patient Instructions (Addendum)
MEDICARE ANNUAL WELLNESS VISIT Health Maintenance Summary and Written Plan of Care  Ms. Kristen Gay ,  Thank you for allowing me to perform your Medicare Annual Wellness Visit and for your ongoing commitment to your health.   Health Maintenance & Immunization History Health Maintenance  Topic Date Due   Pneumonia Vaccine 86+ Years old (3 of 3 - PPSV23 or PCV20) 12/01/2023 (Originally 11/06/2015)   DEXA SCAN  12/01/2023 (Originally 09/21/2013)   INFLUENZA VACCINE  02/18/2023   Medicare Annual Wellness (AWV)  12/01/2023   MAMMOGRAM  12/12/2023   COLONOSCOPY (Pts 45-67yrs Insurance coverage will need to be confirmed)  05/17/2024   DTaP/Tdap/Td (2 - Td or Tdap) 12/18/2024   COVID-19 Vaccine  Completed   Hepatitis C Screening  Completed   Zoster Vaccines- Shingrix  Completed   HPV VACCINES  Aged Out   Immunization History  Administered Date(s) Administered   COVID-19, mRNA, vaccine(Comirnaty)12 years and older 04/08/2022   Influenza Whole 04/30/2006   Influenza, High Dose Seasonal PF 07/01/2017, 06/02/2018, 04/08/2022   Influenza,inj,Quad PF,6+ Mos 05/14/2014, 04/19/2017   Moderna SARS-COV2 Booster Vaccination 11/06/2020   Moderna Sars-Covid-2 Vaccination 08/26/2019, 09/23/2019, 05/15/2020   Pneumococcal Conjugate-13 11/06/2014   Pneumococcal Polysaccharide-23 04/30/2006   Tdap 12/19/2014   Zoster Recombinat (Shingrix) 06/05/2022, 08/24/2022   Zoster, Live 12/19/2014    These are the patient goals that we discussed:  Goals Addressed               This Visit's Progress     Patient Stated (pt-stated)        Patient stated that she would like to continue to maintain her current lifestyle.         This is a list of Health Maintenance Items that are overdue or due now: Pneumococcal vaccine  Bone densitometry screening Mammogram Lung cancer screening- declined  Patient declined bone density scan.     Orders/Referrals Placed Today: Orders Placed This Encounter  Procedures    Mammogram 3D SCREEN BREAST BILATERAL    Standing Status:   Future    Standing Expiration Date:   12/01/2023    Scheduling Instructions:     Please call patient to schedule.    Order Specific Question:   Reason for Exam (SYMPTOM  OR DIAGNOSIS REQUIRED)    Answer:   breast cancer screening    Order Specific Question:   Preferred imaging location?    Answer:   [facility]   (Contact our referral department at [phone] if you have not spoken with someone about your referral appointment within the next 5 days)    Follow-up Plan Follow-up with Agapito Games, MD as planned Discuss pneumonia vaccine with PCP.  Medicare wellness visit in one year.  AVS printed and mailed to the patient.      Health Maintenance, Female Adopting a healthy lifestyle and getting preventive care are important in promoting health and wellness. Ask your health care provider about: The right schedule for you to have regular tests and exams. Things you can do on your own to prevent diseases and keep yourself healthy. What should I know about diet, weight, and exercise? Eat a healthy diet  Eat a diet that includes plenty of vegetables, fruits, low-fat dairy products, and lean protein. Do not eat a lot of foods that are high in solid fats, added sugars, or sodium. Maintain a healthy weight Body mass index (BMI) is used to identify weight problems. It estimates body fat based on height and weight. Your health care  provider can help determine your BMI and help you achieve or maintain a healthy weight. Get regular exercise Get regular exercise. This is one of the most important things you can do for your health. Most adults should: Exercise for at least 150 minutes each week. The exercise should increase your heart rate and make you sweat (moderate-intensity exercise). Do strengthening exercises at least twice a week. This is in addition to the moderate-intensity exercise. Spend less time  sitting. Even light physical activity can be beneficial. Watch cholesterol and blood lipids Have your blood tested for lipids and cholesterol at 74 years of age, then have this test every 5 years. Have your cholesterol levels checked more often if: Your lipid or cholesterol levels are high. You are older than 74 years of age. You are at high risk for heart disease. What should I know about cancer screening? Depending on your health history and family history, you may need to have cancer screening at various ages. This may include screening for: Breast cancer. Cervical cancer. Colorectal cancer. Skin cancer. Lung cancer. What should I know about heart disease, diabetes, and high blood pressure? Blood pressure and heart disease High blood pressure causes heart disease and increases the risk of stroke. This is more likely to develop in people who have high blood pressure readings or are overweight. Have your blood pressure checked: Every 3-5 years if you are 56-65 years of age. Every year if you are 37 years old or older. Diabetes Have regular diabetes screenings. This checks your fasting blood sugar level. Have the screening done: Once every three years after age 55 if you are at a normal weight and have a low risk for diabetes. More often and at a younger age if you are overweight or have a high risk for diabetes. What should I know about preventing infection? Hepatitis B If you have a higher risk for hepatitis B, you should be screened for this virus. Talk with your health care provider to find out if you are at risk for hepatitis B infection. Hepatitis C Testing is recommended for: Everyone born from [birth year] through 1967. Anyone with known risk factors for hepatitis C. Sexually transmitted infections (STIs) Get screened for STIs, including gonorrhea and chlamydia, if: You are sexually active and are younger than 74 years of age. You are older than 74 years of age and your health care  provider tells you that you are at risk for this type of infection. Your sexual activity has changed since you were last screened, and you are at increased risk for chlamydia or gonorrhea. Ask your health care provider if you are at risk. Ask your health care provider about whether you are at high risk for HIV. Your health care provider may recommend a prescription medicine to help prevent HIV infection. If you choose to take medicine to prevent HIV, you should first get tested for HIV. You should then be tested every 3 months for as long as you are taking the medicine. Pregnancy If you are about to stop having your period (premenopausal) and you may become pregnant, seek counseling before you get pregnant. Take 400 to 800 micrograms (mcg) of folic acid every day if you become pregnant. Ask for birth control (contraception) if you want to prevent pregnancy. Osteoporosis and menopause Osteoporosis is a disease in which the bones lose minerals and strength with aging. This can result in bone fractures. If you are 79 years old or older, or if you are at risk for  osteoporosis and fractures, ask your health care provider if you should: Be screened for bone loss. Take a calcium or vitamin D supplement to lower your risk of fractures. Be given hormone replacement therapy (HRT) to treat symptoms of menopause. Follow these instructions at home: Alcohol use Do not drink alcohol if: Your health care provider tells you not to drink. You are pregnant, may be pregnant, or are planning to become pregnant. If you drink alcohol: Limit how much you have to: 0-1 drink a day. Know how much alcohol is in your drink. In the [place] one drink equals one 12 oz bottle of beer (355 mL), one 5 oz glass of wine (148 mL), or one 1 oz glass of hard liquor (44 mL). Lifestyle Do not use any products that contain nicotine or tobacco. These products include cigarettes, chewing tobacco, and vaping devices, such as e-cigarettes.  If you need help quitting, ask your health care provider. Do not use street drugs. Do not share needles. Ask your health care provider for help if you need support or information about quitting drugs. General instructions Schedule regular health, dental, and eye exams. Stay current with your vaccines. Tell your health care provider if: You often feel depressed. You have ever been abused or do not feel safe at home. Summary Adopting a healthy lifestyle and getting preventive care are important in promoting health and wellness. Follow your health care provider's instructions about healthy diet, exercising, and getting tested or screened for diseases. Follow your health care provider's instructions on monitoring your cholesterol and blood pressure. This information is not intended to replace advice given to you by your health care provider. Make sure you discuss any questions you have with your health care provider. Document Revised: 11/25/2020 Document Reviewed: 11/25/2020 Elsevier Patient Education  2023 [employer].

## 2022-12-01 NOTE — Progress Notes (Signed)
MEDICARE ANNUAL WELLNESS VISIT  12/01/2022  Telephone Visit Disclaimer This Medicare AWV was conducted by telephone due to national recommendations for restrictions regarding the COVID-19 Pandemic (e.g. social distancing).  I verified, using two identifiers, that I am speaking with Kristen Gay or their authorized healthcare agent. I discussed the limitations, risks, security, and privacy concerns of performing an evaluation and management service by telephone and the potential availability of an in-person appointment in the future. The patient expressed understanding and agreed to proceed.  Location of Patient: Home Location of Provider (nurse):  In the office.  Subjective:    Kristen Gay is a 72 y.o. female patient of Metheney, Barbarann Ehlers, MD who had a Medicare Annual Wellness Visit today via telephone. Kristen Gay is Retired and lives with their spouse. Kristen Gay has 1 child. Kristen Gay reports that Kristen Gay is socially active and does interact with friends/family regularly. Kristen Gay is moderately physically active and enjoys doing watching television, working on her inside plants, organizing her house and reading.  Patient Care Team: Agapito Games, MD as PCP - General (Family Medicine) Lenord Carbo., MD as Referring Physician (Neurology)     12/01/2022    8:55 AM 11/25/2021    9:40 AM 11/08/2020    3:03 PM 10/26/2018   11:12 AM 11/06/2015    1:06 PM 11/06/2014    1:16 PM  Advanced Directives  Does Patient Have a Medical Advance Directive? No No No No No No  Would patient like information on creating a medical advance directive? No - Patient declined No - Patient declined No - Patient declined No - Patient declined No - patient declined information Yes - Educational materials given    Hospital Utilization Over the Past 12 Months: # of hospitalizations or ER visits: 0 # of surgeries: 0  Review of Systems    Patient reports that her overall health is unchanged compared to last year.  History  obtained from chart review and the patient  Patient Reported Readings (BP, Pulse, CBG, Weight, etc) none  Pain Assessment Pain : No/denies pain     Current Medications & Allergies (verified) Allergies as of 12/01/2022       Reactions   Codeine Other (See Comments)   Headache   Cortisone         Medication List        Accurate as of Dec 01, 2022  9:24 AM. If you have any questions, ask your nurse or doctor.          Ibuprofen-Famotidine 800-26.6 MG Tabs Commonly known as: Duexis Take 1 tablet by mouth daily.   losartan 25 MG tablet Commonly known as: COZAAR Take 1 tablet (25 mg total) by mouth daily.   rosuvastatin 20 MG tablet Commonly known as: CRESTOR Take 1 tablet (20 mg total) by mouth daily.        History (reviewed): Past Medical History:  Diagnosis Date   Hypercholesteremia    Past Surgical History:  Procedure Laterality Date   APPENDECTOMY     carpel tunnel release Bilateral    Family History  Problem Relation Age of Onset   Osler-Weber-Rendu syndrome Sister    Hearing loss Sister    Peripheral vascular disease Sister    Heart Problems Sister        has a pacemaker   Social History   Socioeconomic History   Marital status: Married    Spouse name: Archie   Number of children: 1   Years of education: 39  Highest education level: 12th grade  Occupational History   Occupation: statitical asst.    Comment: retired  Tobacco Use   Smoking status: Every Day    Packs/day: 0.50    Years: 30.00    Additional pack years: 0.00    Total pack years: 15.00    Types: Cigarettes   Smokeless tobacco: Never   Tobacco comments:    1/2 pack a day   Vaping Use   Vaping Use: Never used  Substance and Sexual Activity   Alcohol use: Yes    Comment: occassional a mixed a drink on a holiday.   Drug use: Never   Sexual activity: Not Currently    Partners: Male  Other Topics Concern   Not on file  Social History Narrative   Lives with her  husband. Sister just passed away in 12/12/18 and still misses her. Her hobbies include doing watching television, organizing her house and reading.   Social Determinants of Health   Financial Resource Strain: Low Risk  (12/01/2022)   Overall Financial Resource Strain (CARDIA)    Difficulty of Paying Living Expenses: Not hard at all  Food Insecurity: No Food Insecurity (12/01/2022)   Hunger Vital Sign    Worried About Running Out of Food in the Last Year: Never true    Ran Out of Food in the Last Year: Never true  Transportation Needs: No Transportation Needs (12/01/2022)   PRAPARE - Administrator, Civil Service (Medical): No    Lack of Transportation (Non-Medical): No  Physical Activity: Sufficiently Active (12/01/2022)   Exercise Vital Sign    Days of Exercise per Week: 3 days    Minutes of Exercise per Session: 50 min  Stress: No Stress Concern Present (12/01/2022)   [facility]risten Gay of Occupational Health - Occupational Stress Questionnaire    Feeling of Stress : Not at all  Social Connections: Moderately Isolated (11/25/2021)   Social Connection and Isolation Panel [NHANES]    Frequency of Communication with Friends and Family: More than three times a week    Frequency of Social Gatherings with Friends and Family: Once a week    Attends Religious Services: Never    Database administrator or Organizations: No    Attends Banker Meetings: Never    Marital Status: Married    Activities of Daily Living    12/01/2022    9:00 AM  In your present state of health, do you have any difficulty performing the following activities:  Hearing? 0  Vision? 0  Difficulty concentrating or making decisions? 0  Walking or climbing stairs? 0  Dressing or bathing? 0  Doing errands, shopping? 0  Preparing Food and eating ? N  Using the Toilet? N  In the past six months, have you accidently leaked urine? N  Do you have problems with loss of bowel control? N  Managing your  Medications? N  Managing your Finances? N  Housekeeping or managing your Housekeeping? N    Patient Education/ Literacy How often do you need to have someone help you when you read instructions, pamphlets, or other written materials from your doctor or pharmacy?: 1 - Never What is the last grade level you completed in school?: 12th grade and some college  Exercise Current Exercise Habits: Home exercise routine, Type of exercise: strength training/weights, Time (Minutes): 50, Frequency (Times/Week): 3, Weekly Exercise (Minutes/Week): 150, Intensity: Moderate, Exercise limited by: None identified  Diet Patient reports consuming  4  meals a  day and 0 snack(s) a day Patient reports that her primary diet is: Regular Patient reports that Kristen Gay does have regular access to food.   Depression Screen    12/01/2022    8:55 AM 05/21/2022   10:00 AM 05/21/2022    9:59 AM 11/25/2021    9:41 AM 11/05/2021   10:07 AM 03/03/2021   11:52 AM 11/08/2020    3:03 PM  PHQ 2/9 Scores  PHQ - 2 Score 0 0 0 0 0 2 0  PHQ- 9 Score      4      Fall Risk    12/01/2022    8:55 AM 05/21/2022    9:59 AM 11/25/2021    9:40 AM 11/05/2021   10:06 AM 03/03/2021   11:52 AM  Fall Risk   Falls in the past year? 0 0 0 0 0  Number falls in past yr: 0 1 0 0 0  Injury with Fall? 0 0 0 0 0  Risk for fall due to : No Fall Risks No Fall Risks No Fall Risks No Fall Risks   Follow up Falls evaluation completed Falls evaluation completed Falls evaluation completed Falls prevention discussed;Falls evaluation completed      Objective:  Kristen Gay seemed alert and oriented and Kristen Gay participated appropriately during our telephone visit.  Blood Pressure Weight BMI  BP Readings from Last 3 Encounters:  08/24/22 130/69  06/04/22 125/75  05/21/22 (!) 152/68   Wt Readings from Last 3 Encounters:  06/05/22 141 lb (64 kg)  06/04/22 141 lb (64 kg)  05/21/22 144 lb 0.6 oz (65.3 kg)   BMI Readings from Last 1 Encounters:  08/24/22  22.08 kg/m    *Unable to obtain current vital signs, weight, and BMI due to telephone visit type  Hearing/Vision  Kristen Gay did not seem to have difficulty with hearing/understanding during the telephone conversation Reports that Kristen Gay has not had a formal eye exam by an eye care professional within the past year Reports that Kristen Gay has not had a formal hearing evaluation within the past year *Unable to fully assess hearing and vision during telephone visit type  Cognitive Function:    12/01/2022    9:01 AM 11/25/2021    9:54 AM 11/08/2020    3:15 PM 10/26/2018   11:19 AM 10/19/2017   10:46 AM  6CIT Screen  What Year? 0 points 0 points 0 points 0 points 0 points  What month? 0 points 0 points 0 points 0 points 0 points  What time? 0 points 0 points 0 points 0 points 0 points  Count back from 20 0 points 0 points 0 points 0 points 0 points  Months in reverse 0 points 0 points 0 points 0 points 2 points  Repeat phrase 2 points 0 points 0 points 0 points 0 points  Total Score 2 points 0 points 0 points 0 points 2 points   (Normal:0-7, Significant for Dysfunction: >8)  Normal Cognitive Function Screening: Yes   Immunization & Health Maintenance Record Immunization History  Administered Date(s) Administered   COVID-19, mRNA, vaccine(Comirnaty)12 years and older 04/08/2022   Influenza Whole 04/30/2006   Influenza, High Dose Seasonal PF 07/01/2017, 06/02/2018, 04/08/2022   Influenza,inj,Quad PF,6+ Mos 05/14/2014, 04/19/2017   Moderna SARS-COV2 Booster Vaccination 11/06/2020   Moderna Sars-Covid-2 Vaccination 08/26/2019, 09/23/2019, 05/15/2020   Pneumococcal Conjugate-13 11/06/2014   Pneumococcal Polysaccharide-23 04/30/2006   Tdap 12/19/2014   Zoster Recombinat (Shingrix) 06/05/2022, 08/24/2022   Zoster, Live 12/19/2014    Health  Maintenance  Topic Date Due   Pneumonia Vaccine 62+ Years old (3 of 3 - PPSV23 or PCV20) 12/01/2023 (Originally 11/06/2015)   DEXA SCAN  12/01/2023 (Originally  09/21/2013)   INFLUENZA VACCINE  02/18/2023   Medicare Annual Wellness (AWV)  12/01/2023   MAMMOGRAM  12/12/2023   COLONOSCOPY (Pts 45-15yrs Insurance coverage will need to be confirmed)  05/17/2024   DTaP/Tdap/Td (2 - Td or Tdap) 12/18/2024   COVID-19 Vaccine  Completed   Hepatitis C Screening  Completed   Zoster Vaccines- Shingrix  Completed   HPV VACCINES  Aged Out       Assessment  This is a routine wellness examination for Kristen Gay.  Health Maintenance: Due or Overdue There are no preventive care reminders to display for this patient.   Tarren Schelle does not need a referral for [employer] Assistance: Care Management:   no Social Work:    no Prescription Assistance:  no Nutrition/Diabetes Education:  no   Plan:  Personalized Goals  Goals Addressed               This Visit's Progress     Patient Stated (pt-stated)        Patient stated that Kristen Gay would like to continue to maintain her current lifestyle.       Personalized Health Maintenance & Screening Recommendations  Pneumococcal vaccine  Bone densitometry screening Mammogram  Patient declined bone density scan.   Lung Cancer Screening Recommended: yes but patient declined. (Low Dose CT Chest recommended if Age 15-80 years, 20 pack-year currently smoking OR have quit w/in past 15 years) Hepatitis C Screening recommended: no HIV Screening recommended: no  Advanced Directives: Written information was not prepared per patient's request.  Referrals & Orders Orders Placed This Encounter  Procedures   Mammogram 3D SCREEN BREAST BILATERAL    Follow-up Plan Follow-up with Agapito Games, MD as planned Discuss pneumonia vaccine with PCP.  Medicare wellness visit in one year.  AVS printed and mailed to the patient.   I have personally reviewed and noted the following in the patient's chart:   Medical and social history Use of alcohol, tobacco or illicit drugs  Current medications and  supplements Functional ability and status Nutritional status Physical activity Advanced directives List of other physicians Hospitalizations, surgeries, and ER visits in previous 12 months Vitals Screenings to include cognitive, depression, and falls Referrals and appointments  In addition, I have reviewed and discussed with Kristen Gay certain preventive protocols, quality metrics, and best practice recommendations. A written personalized care plan for preventive services as well as general preventive health recommendations is available and can be mailed to the patient at her request.      Modesto Charon, RN BSN  12/01/2022

## 2022-12-24 ENCOUNTER — Ambulatory Visit (INDEPENDENT_AMBULATORY_CARE_PROVIDER_SITE_OTHER): Payer: Medicare Other | Admitting: Family Medicine

## 2022-12-24 ENCOUNTER — Encounter: Payer: Self-pay | Admitting: Family Medicine

## 2022-12-24 ENCOUNTER — Ambulatory Visit (INDEPENDENT_AMBULATORY_CARE_PROVIDER_SITE_OTHER): Payer: Medicare Other

## 2022-12-24 VITALS — BP 127/75 | HR 87 | Ht 67.0 in | Wt 147.0 lb

## 2022-12-24 DIAGNOSIS — Z1231 Encounter for screening mammogram for malignant neoplasm of breast: Secondary | ICD-10-CM

## 2022-12-24 DIAGNOSIS — I10 Essential (primary) hypertension: Secondary | ICD-10-CM

## 2022-12-24 DIAGNOSIS — E785 Hyperlipidemia, unspecified: Secondary | ICD-10-CM | POA: Diagnosis not present

## 2022-12-24 DIAGNOSIS — W57XXXA Bitten or stung by nonvenomous insect and other nonvenomous arthropods, initial encounter: Secondary | ICD-10-CM | POA: Diagnosis not present

## 2022-12-24 DIAGNOSIS — Z0000 Encounter for general adult medical examination without abnormal findings: Secondary | ICD-10-CM

## 2022-12-24 DIAGNOSIS — R7301 Impaired fasting glucose: Secondary | ICD-10-CM

## 2022-12-24 LAB — POCT GLYCOSYLATED HEMOGLOBIN (HGB A1C): Hemoglobin A1C: 6.1 % — AB (ref 4.0–5.6)

## 2022-12-24 MED ORDER — DOXYCYCLINE HYCLATE 100 MG PO TABS
100.0000 mg | ORAL_TABLET | Freq: Two times a day (BID) | ORAL | 0 refills | Status: DC
Start: 2022-12-24 — End: 2023-07-15

## 2022-12-24 NOTE — Progress Notes (Signed)
   Established Patient Office Visit  Subjective   Patient ID: Kristen Gay, female    DOB: 1950-08-10  Age: 72 y.o. MRN: [id number]  Chief Complaint  Patient presents with   Hypertension    HPI Hypertension- Pt denies chest pain, SOB, dizziness, or heart palpitations.  Taking meds as directed w/o problems.  Denies medication side effects.    Due for mammogram  She hit a bit eon her right outer upper arm that is not healing well.   Impaired fasting glucose-no increased thirst or urination. No symptoms consistent with hypoglycemia.      ROS    Objective:     BP 127/75   Pulse 87   Ht 5\' 7"  (1.702 m)   Wt 147 lb (66.7 kg)   SpO2 97%   BMI 23.02 kg/m    Physical Exam   Results for orders placed or performed in visit on 12/24/22  POCT glycosylated hemoglobin (Hb A1C)  Result Value Ref Range   Hemoglobin A1C 6.1 (A) 4.0 - 5.6 %   HbA1c POC (<> result, manual entry)     HbA1c, POC (prediabetic range)     HbA1c, POC (controlled diabetic range)        The ASCVD Risk score (Arnett DK, et al., 2019) failed to calculate for the following reasons:   The valid total cholesterol range is 130 to 320 mg/dL    Assessment & Plan:   Problem List Items Addressed This Visit       Cardiovascular and Mediastinum   HTN (hypertension)    Well controlled. Continue current regimen. Follow up in  6 mo she is off her BP pill.       Relevant Orders   Lipid Panel w/reflex Direct LDL   COMPLETE METABOLIC PANEL WITH GFR     Endocrine   IFG (impaired fasting glucose)    A1c looks better today at 6.1, down from 6.3.  We discussed normal is less than 5.7 and continue to work on dietary changes and exercise.  She has already cut out a lot of sweets and sodas and juices.  She is thinking about getting back into water aerobics which I think would be fantastic.      Relevant Orders   POCT glycosylated hemoglobin (Hb A1C) (Completed)     Other   Hyperlipidemia LDL goal <130 -  Primary    Tolerating stain well. Due to recheck lipids.        Relevant Orders   Lipid Panel w/reflex Direct LDL   COMPLETE METABOLIC PANEL WITH GFR   Other Visit Diagnoses     Bug bite, initial encounter       Relevant Medications   doxycycline (VIBRA-TABS) 100 MG tablet       No follow-ups on file.    Nani Gasser, MD

## 2022-12-24 NOTE — Assessment & Plan Note (Signed)
A1c looks better today at 6.1, down from 6.3.  We discussed normal is less than 5.7 and continue to work on dietary changes and exercise.  She has already cut out a lot of sweets and sodas and juices.  She is thinking about getting back into water aerobics which I think would be fantastic.

## 2022-12-24 NOTE — Assessment & Plan Note (Signed)
Tolerating stain well. Due to recheck lipids.

## 2022-12-24 NOTE — Assessment & Plan Note (Signed)
Well controlled. Continue current regimen. Follow up in  6 mo she is off her BP pill.

## 2022-12-25 LAB — COMPLETE METABOLIC PANEL WITH GFR
AG Ratio: 1.2 (calc) (ref 1.0–2.5)
ALT: 12 U/L (ref 6–29)
AST: 19 U/L (ref 10–35)
Albumin: 4.4 g/dL (ref 3.6–5.1)
Alkaline phosphatase (APISO): 73 U/L (ref 37–153)
BUN: 13 mg/dL (ref 7–25)
CO2: 23 mmol/L (ref 20–32)
Calcium: 9.6 mg/dL (ref 8.6–10.4)
Chloride: 106 mmol/L (ref 98–110)
Creat: 0.75 mg/dL (ref 0.60–1.00)
Globulin: 3.8 g/dL (calc) — ABNORMAL HIGH (ref 1.9–3.7)
Glucose, Bld: 108 mg/dL — ABNORMAL HIGH (ref 65–99)
Potassium: 4 mmol/L (ref 3.5–5.3)
Sodium: 141 mmol/L (ref 135–146)
Total Bilirubin: 0.6 mg/dL (ref 0.2–1.2)
Total Protein: 8.2 g/dL — ABNORMAL HIGH (ref 6.1–8.1)
eGFR: 83 mL/min/{1.73_m2} (ref >=60)

## 2022-12-25 LAB — LIPID PANEL W/REFLEX DIRECT LDL
Cholesterol: 158 mg/dL (ref <200)
HDL: 39 mg/dL — ABNORMAL LOW (ref >=50)
LDL Cholesterol (Calc): 94 mg/dL (calc)
Non-HDL Cholesterol (Calc): 119 mg/dL (calc) (ref <130)
Total CHOL/HDL Ratio: 4.1 (calc) (ref <5.0)
Triglycerides: 152 mg/dL — ABNORMAL HIGH (ref <150)

## 2022-12-25 NOTE — Progress Notes (Signed)
HI Kristen Gay,  Your LDL cholesterol went up slightly compared to last year.  Triglycerides are a little borderline as well.  These make sure you are taking the Crestor regularly.  Protein level mildly elevated but stable.  Liver enzymes are normal.

## 2022-12-25 NOTE — Progress Notes (Signed)
Please call patient. Normal mammogram.  Repeat in 1 year.  

## 2023-04-09 DIAGNOSIS — Z23 Encounter for immunization: Secondary | ICD-10-CM | POA: Diagnosis not present

## 2023-05-28 ENCOUNTER — Telehealth: Payer: Self-pay | Admitting: Family Medicine

## 2023-05-28 DIAGNOSIS — N281 Cyst of kidney, acquired: Secondary | ICD-10-CM

## 2023-05-28 DIAGNOSIS — E785 Hyperlipidemia, unspecified: Secondary | ICD-10-CM

## 2023-05-28 DIAGNOSIS — J449 Chronic obstructive pulmonary disease, unspecified: Secondary | ICD-10-CM

## 2023-05-28 DIAGNOSIS — M8580 Other specified disorders of bone density and structure, unspecified site: Secondary | ICD-10-CM

## 2023-05-28 NOTE — Telephone Encounter (Signed)
Patient called requesting a refill of ; rosuvastatin (CRESTOR) 20 MG tablet [[id number]]   Patient would like to have it set for 3 refills.  Pharmacy ;   [facility] - [place], [place] - [id number]

## 2023-05-31 MED ORDER — ROSUVASTATIN CALCIUM 20 MG PO TABS
20.0000 mg | ORAL_TABLET | Freq: Every day | ORAL | 3 refills | Status: AC
Start: 2023-05-31 — End: ?

## 2023-05-31 NOTE — Telephone Encounter (Signed)
Refills sent to mail order 

## 2023-07-15 ENCOUNTER — Ambulatory Visit: Payer: Medicare Other | Admitting: Family Medicine

## 2023-07-15 ENCOUNTER — Encounter: Payer: Self-pay | Admitting: Family Medicine

## 2023-07-15 VITALS — BP 134/75 | HR 110 | Temp 99.1°F

## 2023-07-15 DIAGNOSIS — J449 Chronic obstructive pulmonary disease, unspecified: Secondary | ICD-10-CM

## 2023-07-15 DIAGNOSIS — R509 Fever, unspecified: Secondary | ICD-10-CM | POA: Diagnosis not present

## 2023-07-15 DIAGNOSIS — J029 Acute pharyngitis, unspecified: Secondary | ICD-10-CM | POA: Diagnosis not present

## 2023-07-15 LAB — POCT RAPID STREP A (OFFICE): Rapid Strep A Screen: NEGATIVE

## 2023-07-15 LAB — POCT INFLUENZA A/B
Influenza A, POC: NEGATIVE
Influenza B, POC: NEGATIVE

## 2023-07-15 LAB — POC COVID19 BINAXNOW: SARS Coronavirus 2 Ag: NEGATIVE

## 2023-07-15 MED ORDER — ALBUTEROL SULFATE HFA 108 (90 BASE) MCG/ACT IN AERS
2.0000 | INHALATION_SPRAY | Freq: Four times a day (QID) | RESPIRATORY_TRACT | 0 refills | Status: AC | PRN
Start: 2023-07-15 — End: ?

## 2023-07-15 MED ORDER — AZITHROMYCIN 250 MG PO TABS: ORAL_TABLET | ORAL | 0 refills | Status: AC

## 2023-07-15 NOTE — Progress Notes (Signed)
Acute Office Visit  Subjective:     Patient ID: Kristen Gay, female    DOB: 1951-04-13, 72 y.o.   MRN: [id number]  Chief Complaint  Patient presents with   Sore Throat    HPI Patient is in today for feeling sick.  She started with a sore throat about 5 days ago on Sunday and then Monday night that she felt cotton headed and then started to feel little congested yesterday and then woke up with a productive cough this morning.  Does have COPD.  Been using some throat spray for the sore throat it still painful to swallow but not as bad as it was a couple days ago.  She is a smoker.  She says last Friday she was out shopping and someone coughed riding her face.  ROS      Objective:    BP 134/75   Pulse (!) 110   Temp 99.1 F (37.3 C)   SpO2 98%    Physical Exam Constitutional:      Appearance: Normal appearance.  HENT:     Head: Normocephalic and atraumatic.     Right Ear: Tympanic membrane, ear canal and external ear normal. There is no impacted cerumen.     Left Ear: Tympanic membrane, ear canal and external ear normal. There is no impacted cerumen.     Nose: Nose normal.     Mouth/Throat:     Pharynx: Oropharynx is clear.  Eyes:     Conjunctiva/sclera: Conjunctivae normal.  Cardiovascular:     Rate and Rhythm: Normal rate and regular rhythm.  Pulmonary:     Effort: Pulmonary effort is normal.     Breath sounds: Normal breath sounds.  Musculoskeletal:     Cervical back: Neck supple. No tenderness.  Lymphadenopathy:     Cervical: No cervical adenopathy.  Skin:    General: Skin is warm and dry.  Neurological:     Mental Status: She is alert and oriented to person, place, and time.  Psychiatric:        Mood and Affect: Mood normal.     Results for orders placed or performed in visit on 07/15/23  POC COVID-19  Result Value Ref Range   SARS Coronavirus 2 Ag Negative Negative  POCT Influenza A/B  Result Value Ref Range   Influenza A, POC Negative Negative    Influenza B, POC Negative Negative  POCT rapid strep A  Result Value Ref Range   Rapid Strep A Screen Negative Negative        Assessment & Plan:   Problem List Items Addressed This Visit       Respiratory   COPD, mild (HCC) - Primary   Relevant Medications   albuterol (VENTOLIN HFA) 108 (90 Base) MCG/ACT inhaler   azithromycin (ZITHROMAX) 250 MG tablet   Other Visit Diagnoses       Sore throat       Relevant Orders   POC COVID-19 (Completed)   POCT Influenza A/B (Completed)   POCT rapid strep A (Completed)     Fever, unspecified fever cause       Relevant Orders   POC COVID-19 (Completed)   POCT Influenza A/B (Completed)   POCT rapid strep A (Completed)       Lower respiratory tract infection-we will go ahead and treat with azithromycin and send over albuterol that she does not have 1 on file since she does have underlying COPD.  Call if not better in 1 week.  Okay  to continue symptomatic care.  Meds ordered this encounter  Medications   albuterol (VENTOLIN HFA) 108 (90 Base) MCG/ACT inhaler    Sig: Inhale 2 puffs into the lungs every 6 (six) hours as needed for wheezing or shortness of breath.    Dispense:  18 g    Refill:  0   azithromycin (ZITHROMAX) 250 MG tablet    Sig: 2 Ttabs PO on Day 1, then one a day x 4 days.    Dispense:  6 tablet    Refill:  0    No follow-ups on file.  Nani Gasser, MD

## 2023-07-20 ENCOUNTER — Telehealth: Payer: Self-pay

## 2023-07-20 MED ORDER — PREDNISONE 20 MG PO TABS: 40.0000 mg | ORAL_TABLET | Freq: Every day | ORAL | 0 refills | Status: DC

## 2023-07-20 NOTE — Telephone Encounter (Signed)
Meds ordered this encounter  Medications   predniSONE (DELTASONE) 20 MG tablet    Sig: Take 2 tablets (40 mg total) by mouth daily with breakfast.    Dispense:  10 tablet    Refill:  0

## 2023-07-20 NOTE — Telephone Encounter (Signed)
Patient advised.

## 2023-07-20 NOTE — Telephone Encounter (Signed)
 Copied from CRM [id number]. Topic: Clinical - Medical Advice >> Jul 20, 2023 10:29 AM Joesph PARAS wrote: Reason for CRM: Patient calling in to state the dry cough persists after finishing antibiotic. Dry cough persists when lying down particularly and states she is not sleeping well because of it. Patient seeking advice.

## 2023-07-22 ENCOUNTER — Other Ambulatory Visit: Payer: Self-pay

## 2023-07-22 MED ORDER — PREDNISONE 20 MG PO TABS: 40.0000 mg | ORAL_TABLET | Freq: Every day | ORAL | 0 refills | Status: DC

## 2023-07-22 NOTE — Telephone Encounter (Signed)
 Copied from CRM [id number]. Topic: Clinical - Prescription Issue >> Jul 22, 2023  8:24 AM Joesph PARAS wrote: Reason for CRM: Patient calling in to state that she would like recent acute prescription sent to [facility] on [address]. Patient frustrated and states if not possible, will just use salt water.  Researched chart: resent prednisone  to local pharmacy. Original order sent to incorrect pharmacy.

## 2023-11-29 ENCOUNTER — Other Ambulatory Visit: Payer: Self-pay | Admitting: Family Medicine

## 2023-11-29 DIAGNOSIS — Z1231 Encounter for screening mammogram for malignant neoplasm of breast: Secondary | ICD-10-CM

## 2023-12-07 ENCOUNTER — Ambulatory Visit (INDEPENDENT_AMBULATORY_CARE_PROVIDER_SITE_OTHER): Payer: Medicare Other

## 2023-12-07 VITALS — Ht 66.5 in | Wt 138.0 lb

## 2023-12-07 DIAGNOSIS — Z0000 Encounter for general adult medical examination without abnormal findings: Secondary | ICD-10-CM | POA: Diagnosis not present

## 2023-12-07 NOTE — Patient Instructions (Signed)
  Kristen Gay , Thank you for taking time to come for your Medicare Wellness Visit. I appreciate your ongoing commitment to your health goals. Please review the following plan we discussed and let me know if I can assist you in the future.   These are the goals we discussed:  Goals       Exercise 3x per week (30 min per time)      Increase exercise- she has equipment in the house just needs more motivation to do it.      Patient Stated (pt-stated)      11/08/2020 AWV Goal: Exercise for General Health  Patient will verbalize understanding of the benefits of increased physical activity: Exercising regularly is important. It will improve your overall fitness, flexibility, and endurance. Regular exercise also will improve your overall health. It can help you control your weight, reduce stress, and improve your bone density. Over the next year, patient will increase physical activity as tolerated with a goal of at least 150 minutes of moderate physical activity per week.  You can tell that you are exercising at a moderate intensity if your heart starts beating faster and you start breathing faster but can still hold a conversation. Moderate-intensity exercise ideas include: Walking 1 mile (1.6 km) in about 15 minutes Biking Hiking Golfing Dancing Water aerobics Patient will verbalize understanding of everyday activities that increase physical activity by providing examples like the following: Yard work, such as: Insurance underwriter Gardening Washing windows or floors Patient will be able to explain general safety guidelines for exercising:  Before you start a new exercise program, talk with your health care provider. Do not exercise so much that you hurt yourself, feel dizzy, or get very short of breath. Wear comfortable clothes and wear shoes with good support. Drink plenty of water while you exercise to  prevent dehydration or heat stroke. Work out until your breathing and your heartbeat get faster.       Patient Stated (pt-stated)      Continue to maintain healthy habits.      Patient Stated (pt-stated)      Patient stated that she would like to continue to maintain her current lifestyle.      Patient Stated      Patient states she would like to continue current healthy lifestyle.         This is a list of the screening recommended for you and due dates:  Health Maintenance  Topic Date Due   DEXA scan (bone density measurement)  Never done   COVID-19 Vaccine (6 - 2024-25 season) 06/29/2024*   Flu Shot  02/18/2024   Colon Cancer Screening  05/17/2024   Medicare Annual Wellness Visit  12/06/2024   DTaP/Tdap/Td vaccine (2 - Td or Tdap) 12/18/2024   Mammogram  12/23/2024   Pneumonia Vaccine  Completed   Hepatitis C Screening  Completed   Zoster (Shingles) Vaccine  Completed   HPV Vaccine  Aged Out   Meningitis B Vaccine  Aged Out  *Topic was postponed. The date shown is not the original due date.

## 2023-12-07 NOTE — Progress Notes (Signed)
 Subjective:   Kristen Gay is a 73 y.o. female who presents for Medicare Annual (Subsequent) preventive examination.  Visit Complete: Virtual I connected with  Kristen Gay on 12/07/23 by a audio enabled telemedicine application and verified that I am speaking with the correct person using two identifiers.  Patient Location: Home  Provider Location: Office/Clinic  I discussed the limitations of evaluation and management by telemedicine. The patient expressed understanding and agreed to proceed.  Vital Signs: Because this visit was a virtual/telehealth visit, some criteria may be missing or patient reported. Any vitals not documented were not able to be obtained and vitals that have been documented are patient reported.  Patient Medicare AWV questionnaire was completed by the patient on n/a; I have confirmed that all information answered by patient is correct and no changes since this date.  Cardiac Risk Factors include: advanced age (>28men, >3 women);hypertension;dyslipidemia;smoking/ tobacco exposure     Objective:    Today's Vitals   12/07/23 0952  Weight: 138 lb (62.6 kg)  Height: 5' 6.5" (1.689 m)   Body mass index is 21.94 kg/m.     12/07/2023   10:03 AM 12/01/2022    8:55 AM 11/25/2021    9:40 AM 11/08/2020    3:03 PM 10/26/2018   11:12 AM 11/06/2015    1:06 PM 11/06/2014    1:16 PM  Advanced Directives  Does Patient Have a Medical Advance Directive? No No No No No No No  Would patient like information on creating a medical advance directive? No - Patient declined No - Patient declined No - Patient declined No - Patient declined No - Patient declined No - patient declined information Yes - Educational materials given    Current Medications (verified) Outpatient Encounter Medications as of 12/07/2023  Medication Sig   Ibuprofen -Famotidine  (DUEXIS ) 800-26.6 MG TABS Take 1 tablet by mouth daily.   rosuvastatin  (CRESTOR ) 20 MG tablet Take 1 tablet (20 mg total) by mouth  daily.   albuterol  (VENTOLIN  HFA) 108 (90 Base) MCG/ACT inhaler Inhale 2 puffs into the lungs every 6 (six) hours as needed for wheezing or shortness of breath. (Patient not taking: Reported on 12/07/2023)   [DISCONTINUED] predniSONE  (DELTASONE ) 20 MG tablet Take 2 tablets (40 mg total) by mouth daily with breakfast.   No facility-administered encounter medications on file as of 12/07/2023.    Allergies (verified) Codeine and Cortisone   History: Past Medical History:  Diagnosis Date   Hypercholesteremia    Past Surgical History:  Procedure Laterality Date   APPENDECTOMY     carpel tunnel release Bilateral    Family History  Problem Relation Age of Onset   Osler-Weber-Rendu syndrome Sister    Hearing loss Sister    Peripheral vascular disease Sister    Heart Problems Sister        has a pacemaker   Social History   Socioeconomic History   Marital status: Married    Spouse name: Archie   Number of children: 1   Years of education: 12   Highest education level: 12th grade  Occupational History   Occupation: statitical asst.    Comment: retired  Tobacco Use   Smoking status: Every Day    Current packs/day: 0.50    Average packs/day: 0.5 packs/day for 30.0 years (15.0 ttl pk-yrs)    Types: Cigarettes   Smokeless tobacco: Never   Tobacco comments:    1/2 pack a day   Vaping Use   Vaping status: Never Used  Substance and  Sexual Activity   Alcohol use: Yes    Comment: occassional a mixed a drink on a holiday.   Drug use: Never   Sexual activity: Not Currently    Partners: Male  Other Topics Concern   Not on file  Social History Narrative   Lives with her husband. Her hobbies include doing watching television, organizing her house and reading.   Social Drivers of Corporate investment banker Strain: Low Risk  (12/07/2023)   Overall Financial Resource Strain (CARDIA)    Difficulty of Paying Living Expenses: Not hard at all  Food Insecurity: No Food Insecurity  (12/07/2023)   Hunger Vital Sign    Worried About Running Out of Food in the Last Year: Never true    Ran Out of Food in the Last Year: Never true  Transportation Needs: No Transportation Needs (12/07/2023)   PRAPARE - Administrator, Civil Service (Medical): No    Lack of Transportation (Non-Medical): No  Physical Activity: Sufficiently Active (12/07/2023)   Exercise Vital Sign    Days of Exercise per Week: 4 days    Minutes of Exercise per Session: 50 min  Stress: No Stress Concern Present (12/07/2023)   [employer] of Occupational Health - Occupational Stress Questionnaire    Feeling of Stress : Not at all  Social Connections: Moderately Isolated (12/07/2023)   Social Connection and Isolation Panel [NHANES]    Frequency of Communication with Friends and Family: More than three times a week    Frequency of Social Gatherings with Friends and Family: Once a week    Attends Religious Services: Never    Database administrator or Organizations: No    Attends Engineer, structural: Never    Marital Status: Married    Tobacco Counseling Ready to quit: Not Answered Counseling given: Not Answered Tobacco comments: 1/2 pack a day    Clinical Intake:  Pre-visit preparation completed: Yes  Pain : No/denies pain     BMI - recorded: 21.94 Nutritional Status: BMI of 19-24  Normal Nutritional Risks: None Diabetes: No  How often do you need to have someone help you when you read instructions, pamphlets, or other written materials from your doctor or pharmacy?: 1 - Never What is the last grade level you completed in school?: 14  Interpreter Needed?: No      Activities of Daily Living    12/07/2023    9:54 AM  In your present state of health, do you have any difficulty performing the following activities:  Hearing? 0  Vision? 0  Difficulty concentrating or making decisions? 0  Walking or climbing stairs? 0  Dressing or bathing? 0  Doing errands,  shopping? 0  Preparing Food and eating ? N  Using the Toilet? N  In the past six months, have you accidently leaked urine? N  Do you have problems with loss of bowel control? N  Managing your Medications? N  Managing your Finances? N  Housekeeping or managing your Housekeeping? N    Patient Care Team: Cydney Draft, MD as PCP - General (Family Medicine)  Indicate any recent Medical Services you may have received from other than [facility] providers in the past year (date may be approximate).     Assessment:   This is a routine wellness examination for Kristen Gay.  Hearing/Vision screen No results found.   Goals Addressed             This Visit's Progress    Patient  Stated       Patient states she would like to continue current healthy lifestyle.       Depression Screen    12/07/2023   10:01 AM 12/24/2022   10:26 AM 12/01/2022    8:55 AM 05/21/2022   10:00 AM 05/21/2022    9:59 AM 11/25/2021    9:41 AM 11/05/2021   10:07 AM  PHQ 2/9 Scores  PHQ - 2 Score 0 0 0 0 0 0 0    Fall Risk    12/07/2023   10:04 AM 12/24/2022   10:26 AM 12/01/2022    8:55 AM 05/21/2022    9:59 AM 11/25/2021    9:40 AM  Fall Risk   Falls in the past year? 0 0 0 0 0  Number falls in past yr: 0 0 0 1 0  Injury with Fall? 0 0 0 0 0  Risk for fall due to : No Fall Risks No Fall Risks No Fall Risks No Fall Risks No Fall Risks  Follow up Falls evaluation completed Falls evaluation completed Falls evaluation completed Falls evaluation completed Falls evaluation completed    MEDICARE RISK AT HOME: Medicare Risk at Home Any stairs in or around the home?: No Home free of loose throw rugs in walkways, pet beds, electrical cords, etc?: No Adequate lighting in your home to reduce risk of falls?: Yes Life alert?: No Use of a cane, walker or w/c?: No Grab bars in the bathroom?: Yes Shower chair or bench in shower?: No Elevated toilet seat or a handicapped toilet?: No  TIMED UP AND GO:  Was the test  performed?  No    Cognitive Function:        12/07/2023   10:05 AM 12/01/2022    9:01 AM 11/25/2021    9:54 AM 11/08/2020    3:15 PM 10/26/2018   11:19 AM  6CIT Screen  What Year? 0 points 0 points 0 points 0 points 0 points  What month? 0 points 0 points 0 points 0 points 0 points  What time? 0 points 0 points 0 points 0 points 0 points  Count back from 20 0 points 0 points 0 points 0 points 0 points  Months in reverse 0 points 0 points 0 points 0 points 0 points  Repeat phrase 2 points 2 points 0 points 0 points 0 points  Total Score 2 points 2 points 0 points 0 points 0 points    Immunizations Immunization History  Administered Date(s) Administered   Influenza Whole 04/30/2006   Influenza, High Dose Seasonal PF 07/01/2017, 06/02/2018, 04/08/2022   Influenza,inj,Quad PF,6+ Mos 05/14/2014, 04/19/2017   Moderna SARS-COV2 Booster Vaccination 11/06/2020   Moderna Sars-Covid-2 Vaccination 08/26/2019, 09/23/2019, 05/15/2020   PNEUMOCOCCAL CONJUGATE-20 12/08/2021   Pfizer(Comirnaty)Fall Seasonal Vaccine 12 years and older 04/08/2022   Pneumococcal Conjugate-13 11/06/2014   Pneumococcal Polysaccharide-23 04/30/2006   Tdap 12/19/2014   Zoster Recombinant(Shingrix ) 06/05/2022, 08/24/2022   Zoster, Live 12/19/2014    TDAP status: Up to date  Flu Vaccine status: Up to date  Pneumococcal vaccine status: Up to date  Covid-19 vaccine status: Information provided on how to obtain vaccines.   Qualifies for Shingles Vaccine? Yes   Zostavax completed Yes   Shingrix  Completed?: Yes  Screening Tests Health Maintenance  Topic Date Due   DEXA SCAN  Never done   COVID-19 Vaccine (6 - 2024-25 season) 06/29/2024 (Originally 03/21/2023)   INFLUENZA VACCINE  02/18/2024   Colonoscopy  05/17/2024   Medicare Annual Wellness (AWV)  12/06/2024   DTaP/Tdap/Td (2 - Td or Tdap) 12/18/2024   MAMMOGRAM  12/23/2024   Pneumonia Vaccine 78+ Years old  Completed   Hepatitis C Screening  Completed    Zoster Vaccines- Shingrix   Completed   HPV VACCINES  Aged Out   Meningococcal B Vaccine  Aged Out    Health Maintenance  Health Maintenance Due  Topic Date Due   DEXA SCAN  Never done    Colorectal cancer screening: Type of screening: Colonoscopy. Completed 05/17/2014. Repeat every 10 years  Mammogram status: Completed 12/24/2022. Repeat every year  Bone Density - patient declined.   Lung Cancer Screening: (Low Dose CT Chest recommended if Age 34-80 years, 20 pack-year currently smoking OR have quit w/in 15years.) does qualify.   Lung Cancer Screening Referral: patient declined.   Additional Screening:  Hepatitis C Screening: does not qualify; Completed 11/07/2007  Vision Screening: Recommended annual ophthalmology exams for early detection of glaucoma and other disorders of the eye. Is the patient up to date with their annual eye exam?  No  Who is the provider or what is the name of the office in which the patient attends annual eye exams? She will make an appointment.  If pt is not established with a provider, would they like to be referred to a provider to establish care? nla.   Dental Screening: Recommended annual dental exams for proper oral hygiene    Community Resource Referral / Chronic Care Management: CRR required this visit?  No   CCM required this visit?  No     Plan:     I have personally reviewed and noted the following in the patient's chart:   Medical and social history Use of alcohol, tobacco or illicit drugs  Current medications and supplements including opioid prescriptions. Patient is not currently taking opioid prescriptions. Functional ability and status Nutritional status Physical activity Advanced directives List of other physicians Hospitalizations, surgeries, and ER visits in previous 12 months Vitals Screenings to include cognitive, depression, and falls Referrals and appointments  In addition, I have reviewed and discussed with  patient certain preventive protocols, quality metrics, and best practice recommendations. A written personalized care plan for preventive services as well as general preventive health recommendations were provided to patient.     Kristen Gay, CMA   12/07/2023   After Visit Summary: (Mail) Due to this being a telephonic visit, the after visit summary with patients personalized plan was offered to patient via mail   Nurse Notes:   Kristen Gay is a 73 y.o. female patient of Metheney, Kristen Diego, MD who had a Medicare Annual Wellness Visit today via telephone. Kristen Gay is Retired and lives with their spouse. She has 1 child. She reports that she is socially active and does interact with friends/family regularly. She is moderately physically active and enjoys doing watching television, working on her inside plants, organizing her house and reading.

## 2023-12-29 ENCOUNTER — Ambulatory Visit

## 2024-01-12 ENCOUNTER — Ambulatory Visit

## 2024-02-02 ENCOUNTER — Ambulatory Visit

## 2024-02-03 ENCOUNTER — Ambulatory Visit

## 2024-02-03 DIAGNOSIS — Z1231 Encounter for screening mammogram for malignant neoplasm of breast: Secondary | ICD-10-CM | POA: Diagnosis not present

## 2024-02-08 ENCOUNTER — Ambulatory Visit: Payer: Self-pay | Admitting: Family Medicine

## 2024-02-08 NOTE — Progress Notes (Signed)
 Please call patient. Normal mammogram.  Repeat in 1 year.

## 2024-02-23 DIAGNOSIS — Z23 Encounter for immunization: Secondary | ICD-10-CM | POA: Diagnosis not present

## 2024-02-28 DIAGNOSIS — Z23 Encounter for immunization: Secondary | ICD-10-CM | POA: Diagnosis not present

## 2024-12-07 ENCOUNTER — Ambulatory Visit
